# Patient Record
Sex: Female | Born: 1940 | Race: White | Hispanic: No | Smoking: Former smoker
Health system: Southern US, Community
[De-identification: ages and names within clinical notes are randomized; demographics above are authoritative.]

## PROBLEM LIST (undated history)

## (undated) DIAGNOSIS — I10 Essential (primary) hypertension: Secondary | ICD-10-CM

---

## 2011-06-12 DIAGNOSIS — Z79899 Other long term (current) drug therapy: Secondary | ICD-10-CM | POA: Diagnosis not present

## 2011-06-12 DIAGNOSIS — E7800 Pure hypercholesterolemia, unspecified: Secondary | ICD-10-CM | POA: Diagnosis not present

## 2011-06-12 DIAGNOSIS — J209 Acute bronchitis, unspecified: Secondary | ICD-10-CM | POA: Diagnosis not present

## 2011-06-12 DIAGNOSIS — I10 Essential (primary) hypertension: Secondary | ICD-10-CM | POA: Diagnosis not present

## 2011-07-01 DIAGNOSIS — H04129 Dry eye syndrome of unspecified lacrimal gland: Secondary | ICD-10-CM | POA: Diagnosis not present

## 2011-07-01 DIAGNOSIS — H43399 Other vitreous opacities, unspecified eye: Secondary | ICD-10-CM | POA: Diagnosis not present

## 2011-07-01 DIAGNOSIS — IMO0002 Reserved for concepts with insufficient information to code with codable children: Secondary | ICD-10-CM | POA: Diagnosis not present

## 2012-01-02 DIAGNOSIS — Z23 Encounter for immunization: Secondary | ICD-10-CM | POA: Diagnosis not present

## 2012-06-24 DIAGNOSIS — M810 Age-related osteoporosis without current pathological fracture: Secondary | ICD-10-CM | POA: Diagnosis not present

## 2012-06-24 DIAGNOSIS — Z79899 Other long term (current) drug therapy: Secondary | ICD-10-CM | POA: Diagnosis not present

## 2012-06-24 DIAGNOSIS — I10 Essential (primary) hypertension: Secondary | ICD-10-CM | POA: Diagnosis not present

## 2012-06-24 DIAGNOSIS — Z0000 Encounter for general adult medical examination without abnormal findings: Secondary | ICD-10-CM | POA: Diagnosis not present

## 2012-06-24 DIAGNOSIS — E782 Mixed hyperlipidemia: Secondary | ICD-10-CM | POA: Diagnosis not present

## 2012-07-08 DIAGNOSIS — L723 Sebaceous cyst: Secondary | ICD-10-CM | POA: Diagnosis not present

## 2012-09-19 DIAGNOSIS — H10019 Acute follicular conjunctivitis, unspecified eye: Secondary | ICD-10-CM | POA: Diagnosis not present

## 2012-11-22 DIAGNOSIS — H16109 Unspecified superficial keratitis, unspecified eye: Secondary | ICD-10-CM | POA: Diagnosis not present

## 2012-12-22 DIAGNOSIS — H524 Presbyopia: Secondary | ICD-10-CM | POA: Diagnosis not present

## 2012-12-22 DIAGNOSIS — H5200 Hypermetropia, unspecified eye: Secondary | ICD-10-CM | POA: Diagnosis not present

## 2012-12-22 DIAGNOSIS — H52229 Regular astigmatism, unspecified eye: Secondary | ICD-10-CM | POA: Diagnosis not present

## 2012-12-22 DIAGNOSIS — H2589 Other age-related cataract: Secondary | ICD-10-CM | POA: Diagnosis not present

## 2013-02-23 DIAGNOSIS — H524 Presbyopia: Secondary | ICD-10-CM | POA: Diagnosis not present

## 2013-02-23 DIAGNOSIS — H2589 Other age-related cataract: Secondary | ICD-10-CM | POA: Diagnosis not present

## 2013-02-23 DIAGNOSIS — H5200 Hypermetropia, unspecified eye: Secondary | ICD-10-CM | POA: Diagnosis not present

## 2013-02-23 DIAGNOSIS — H52229 Regular astigmatism, unspecified eye: Secondary | ICD-10-CM | POA: Diagnosis not present

## 2013-05-24 DIAGNOSIS — H2589 Other age-related cataract: Secondary | ICD-10-CM | POA: Diagnosis not present

## 2013-05-24 DIAGNOSIS — H52229 Regular astigmatism, unspecified eye: Secondary | ICD-10-CM | POA: Diagnosis not present

## 2013-05-24 DIAGNOSIS — H5200 Hypermetropia, unspecified eye: Secondary | ICD-10-CM | POA: Diagnosis not present

## 2013-05-24 DIAGNOSIS — H524 Presbyopia: Secondary | ICD-10-CM | POA: Diagnosis not present

## 2013-09-02 DIAGNOSIS — H04129 Dry eye syndrome of unspecified lacrimal gland: Secondary | ICD-10-CM | POA: Diagnosis not present

## 2013-09-02 DIAGNOSIS — H2510 Age-related nuclear cataract, unspecified eye: Secondary | ICD-10-CM | POA: Diagnosis not present

## 2013-09-28 DIAGNOSIS — Z780 Asymptomatic menopausal state: Secondary | ICD-10-CM | POA: Diagnosis not present

## 2013-09-28 DIAGNOSIS — R7402 Elevation of levels of lactic acid dehydrogenase (LDH): Secondary | ICD-10-CM | POA: Diagnosis not present

## 2013-09-28 DIAGNOSIS — M810 Age-related osteoporosis without current pathological fracture: Secondary | ICD-10-CM | POA: Diagnosis not present

## 2013-09-28 DIAGNOSIS — R7401 Elevation of levels of liver transaminase levels: Secondary | ICD-10-CM | POA: Diagnosis not present

## 2013-09-28 DIAGNOSIS — R700 Elevated erythrocyte sedimentation rate: Secondary | ICD-10-CM | POA: Diagnosis not present

## 2013-09-28 DIAGNOSIS — Z23 Encounter for immunization: Secondary | ICD-10-CM | POA: Diagnosis not present

## 2013-09-28 DIAGNOSIS — I10 Essential (primary) hypertension: Secondary | ICD-10-CM | POA: Diagnosis not present

## 2013-09-28 DIAGNOSIS — E782 Mixed hyperlipidemia: Secondary | ICD-10-CM | POA: Diagnosis not present

## 2013-10-04 DIAGNOSIS — H268 Other specified cataract: Secondary | ICD-10-CM | POA: Diagnosis not present

## 2013-10-04 DIAGNOSIS — Z961 Presence of intraocular lens: Secondary | ICD-10-CM | POA: Diagnosis not present

## 2013-10-04 DIAGNOSIS — E785 Hyperlipidemia, unspecified: Secondary | ICD-10-CM | POA: Diagnosis not present

## 2013-10-04 DIAGNOSIS — Z9849 Cataract extraction status, unspecified eye: Secondary | ICD-10-CM | POA: Diagnosis not present

## 2013-10-04 DIAGNOSIS — H269 Unspecified cataract: Secondary | ICD-10-CM | POA: Diagnosis not present

## 2013-10-04 DIAGNOSIS — Z79899 Other long term (current) drug therapy: Secondary | ICD-10-CM | POA: Diagnosis not present

## 2013-10-04 DIAGNOSIS — H2510 Age-related nuclear cataract, unspecified eye: Secondary | ICD-10-CM | POA: Diagnosis not present

## 2013-10-04 DIAGNOSIS — Z7982 Long term (current) use of aspirin: Secondary | ICD-10-CM | POA: Diagnosis not present

## 2013-10-04 DIAGNOSIS — H2589 Other age-related cataract: Secondary | ICD-10-CM | POA: Diagnosis not present

## 2013-10-04 DIAGNOSIS — I10 Essential (primary) hypertension: Secondary | ICD-10-CM | POA: Diagnosis not present

## 2013-10-04 DIAGNOSIS — Z87891 Personal history of nicotine dependence: Secondary | ICD-10-CM | POA: Diagnosis not present

## 2013-10-21 DIAGNOSIS — H04123 Dry eye syndrome of bilateral lacrimal glands: Secondary | ICD-10-CM | POA: Diagnosis not present

## 2013-11-29 DIAGNOSIS — Z9841 Cataract extraction status, right eye: Secondary | ICD-10-CM | POA: Diagnosis not present

## 2013-11-29 DIAGNOSIS — H2511 Age-related nuclear cataract, right eye: Secondary | ICD-10-CM | POA: Diagnosis not present

## 2013-11-29 DIAGNOSIS — I10 Essential (primary) hypertension: Secondary | ICD-10-CM | POA: Diagnosis not present

## 2013-11-29 DIAGNOSIS — E785 Hyperlipidemia, unspecified: Secondary | ICD-10-CM | POA: Diagnosis not present

## 2013-11-29 DIAGNOSIS — H269 Unspecified cataract: Secondary | ICD-10-CM | POA: Diagnosis not present

## 2013-11-29 DIAGNOSIS — H25811 Combined forms of age-related cataract, right eye: Secondary | ICD-10-CM | POA: Diagnosis not present

## 2013-11-29 DIAGNOSIS — Z87891 Personal history of nicotine dependence: Secondary | ICD-10-CM | POA: Diagnosis not present

## 2013-11-29 DIAGNOSIS — Z961 Presence of intraocular lens: Secondary | ICD-10-CM | POA: Diagnosis not present

## 2013-11-29 DIAGNOSIS — Z79899 Other long term (current) drug therapy: Secondary | ICD-10-CM | POA: Diagnosis not present

## 2014-03-10 DIAGNOSIS — I10 Essential (primary) hypertension: Secondary | ICD-10-CM | POA: Diagnosis not present

## 2014-03-10 DIAGNOSIS — H5202 Hypermetropia, left eye: Secondary | ICD-10-CM | POA: Diagnosis not present

## 2014-03-10 DIAGNOSIS — H52222 Regular astigmatism, left eye: Secondary | ICD-10-CM | POA: Diagnosis not present

## 2014-03-10 DIAGNOSIS — H524 Presbyopia: Secondary | ICD-10-CM | POA: Diagnosis not present

## 2014-03-10 DIAGNOSIS — H16222 Keratoconjunctivitis sicca, not specified as Sjogren's, left eye: Secondary | ICD-10-CM | POA: Diagnosis not present

## 2014-11-14 DIAGNOSIS — Z23 Encounter for immunization: Secondary | ICD-10-CM | POA: Diagnosis not present

## 2014-11-14 DIAGNOSIS — Z79899 Other long term (current) drug therapy: Secondary | ICD-10-CM | POA: Diagnosis not present

## 2014-11-14 DIAGNOSIS — E7800 Pure hypercholesterolemia, unspecified: Secondary | ICD-10-CM | POA: Diagnosis not present

## 2014-11-14 DIAGNOSIS — I10 Essential (primary) hypertension: Secondary | ICD-10-CM | POA: Diagnosis not present

## 2014-11-14 DIAGNOSIS — Z1389 Encounter for screening for other disorder: Secondary | ICD-10-CM | POA: Diagnosis not present

## 2014-11-14 DIAGNOSIS — R740 Nonspecific elevation of levels of transaminase and lactic acid dehydrogenase [LDH]: Secondary | ICD-10-CM | POA: Diagnosis not present

## 2014-11-14 DIAGNOSIS — Z0000 Encounter for general adult medical examination without abnormal findings: Secondary | ICD-10-CM | POA: Diagnosis not present

## 2014-11-17 DIAGNOSIS — Z23 Encounter for immunization: Secondary | ICD-10-CM | POA: Diagnosis not present

## 2014-11-23 DIAGNOSIS — D539 Nutritional anemia, unspecified: Secondary | ICD-10-CM | POA: Diagnosis not present

## 2014-12-01 DIAGNOSIS — Z1211 Encounter for screening for malignant neoplasm of colon: Secondary | ICD-10-CM | POA: Diagnosis not present

## 2015-01-16 DIAGNOSIS — M4850XA Collapsed vertebra, not elsewhere classified, site unspecified, initial encounter for fracture: Secondary | ICD-10-CM | POA: Diagnosis not present

## 2015-01-16 DIAGNOSIS — D539 Nutritional anemia, unspecified: Secondary | ICD-10-CM | POA: Diagnosis not present

## 2015-01-16 DIAGNOSIS — M545 Low back pain: Secondary | ICD-10-CM | POA: Diagnosis not present

## 2015-02-02 DIAGNOSIS — M545 Low back pain: Secondary | ICD-10-CM | POA: Diagnosis not present

## 2015-04-13 DIAGNOSIS — I10 Essential (primary) hypertension: Secondary | ICD-10-CM | POA: Diagnosis not present

## 2015-04-13 DIAGNOSIS — H43813 Vitreous degeneration, bilateral: Secondary | ICD-10-CM | POA: Diagnosis not present

## 2015-04-13 DIAGNOSIS — Z961 Presence of intraocular lens: Secondary | ICD-10-CM | POA: Diagnosis not present

## 2015-04-13 DIAGNOSIS — H5203 Hypermetropia, bilateral: Secondary | ICD-10-CM | POA: Diagnosis not present

## 2015-04-13 DIAGNOSIS — H353131 Nonexudative age-related macular degeneration, bilateral, early dry stage: Secondary | ICD-10-CM | POA: Diagnosis not present

## 2015-04-13 DIAGNOSIS — H43393 Other vitreous opacities, bilateral: Secondary | ICD-10-CM | POA: Diagnosis not present

## 2015-04-13 DIAGNOSIS — H35373 Puckering of macula, bilateral: Secondary | ICD-10-CM | POA: Diagnosis not present

## 2015-04-13 DIAGNOSIS — Z9841 Cataract extraction status, right eye: Secondary | ICD-10-CM | POA: Diagnosis not present

## 2015-04-13 DIAGNOSIS — Z9842 Cataract extraction status, left eye: Secondary | ICD-10-CM | POA: Diagnosis not present

## 2015-04-13 DIAGNOSIS — H52223 Regular astigmatism, bilateral: Secondary | ICD-10-CM | POA: Diagnosis not present

## 2015-04-13 DIAGNOSIS — H524 Presbyopia: Secondary | ICD-10-CM | POA: Diagnosis not present

## 2015-04-26 DIAGNOSIS — H353132 Nonexudative age-related macular degeneration, bilateral, intermediate dry stage: Secondary | ICD-10-CM | POA: Diagnosis not present

## 2015-05-31 DIAGNOSIS — H353132 Nonexudative age-related macular degeneration, bilateral, intermediate dry stage: Secondary | ICD-10-CM | POA: Diagnosis not present

## 2015-08-21 DIAGNOSIS — M546 Pain in thoracic spine: Secondary | ICD-10-CM | POA: Diagnosis not present

## 2015-08-27 DIAGNOSIS — M5134 Other intervertebral disc degeneration, thoracic region: Secondary | ICD-10-CM | POA: Diagnosis not present

## 2015-08-27 DIAGNOSIS — M4854XA Collapsed vertebra, not elsewhere classified, thoracic region, initial encounter for fracture: Secondary | ICD-10-CM | POA: Diagnosis not present

## 2015-08-27 DIAGNOSIS — M546 Pain in thoracic spine: Secondary | ICD-10-CM | POA: Diagnosis not present

## 2015-08-28 DIAGNOSIS — M546 Pain in thoracic spine: Secondary | ICD-10-CM | POA: Diagnosis not present

## 2015-09-03 DIAGNOSIS — H353131 Nonexudative age-related macular degeneration, bilateral, early dry stage: Secondary | ICD-10-CM | POA: Diagnosis not present

## 2015-09-03 DIAGNOSIS — I10 Essential (primary) hypertension: Secondary | ICD-10-CM | POA: Diagnosis not present

## 2015-09-18 DIAGNOSIS — M546 Pain in thoracic spine: Secondary | ICD-10-CM | POA: Diagnosis not present

## 2015-11-27 DIAGNOSIS — M810 Age-related osteoporosis without current pathological fracture: Secondary | ICD-10-CM | POA: Diagnosis not present

## 2015-11-27 DIAGNOSIS — R700 Elevated erythrocyte sedimentation rate: Secondary | ICD-10-CM | POA: Diagnosis not present

## 2015-11-27 DIAGNOSIS — Z79899 Other long term (current) drug therapy: Secondary | ICD-10-CM | POA: Diagnosis not present

## 2015-11-27 DIAGNOSIS — M4850XA Collapsed vertebra, not elsewhere classified, site unspecified, initial encounter for fracture: Secondary | ICD-10-CM | POA: Diagnosis not present

## 2015-11-27 DIAGNOSIS — Z23 Encounter for immunization: Secondary | ICD-10-CM | POA: Diagnosis not present

## 2015-11-27 DIAGNOSIS — I10 Essential (primary) hypertension: Secondary | ICD-10-CM | POA: Diagnosis not present

## 2015-11-27 DIAGNOSIS — E782 Mixed hyperlipidemia: Secondary | ICD-10-CM | POA: Diagnosis not present

## 2015-11-27 DIAGNOSIS — Z0000 Encounter for general adult medical examination without abnormal findings: Secondary | ICD-10-CM | POA: Diagnosis not present

## 2015-11-27 DIAGNOSIS — D539 Nutritional anemia, unspecified: Secondary | ICD-10-CM | POA: Diagnosis not present

## 2015-12-07 DIAGNOSIS — M810 Age-related osteoporosis without current pathological fracture: Secondary | ICD-10-CM | POA: Diagnosis not present

## 2015-12-20 DIAGNOSIS — K219 Gastro-esophageal reflux disease without esophagitis: Secondary | ICD-10-CM | POA: Diagnosis not present

## 2015-12-20 DIAGNOSIS — D510 Vitamin B12 deficiency anemia due to intrinsic factor deficiency: Secondary | ICD-10-CM | POA: Diagnosis not present

## 2015-12-20 DIAGNOSIS — D649 Anemia, unspecified: Secondary | ICD-10-CM | POA: Diagnosis not present

## 2016-01-01 DIAGNOSIS — D649 Anemia, unspecified: Secondary | ICD-10-CM | POA: Diagnosis not present

## 2016-01-08 DIAGNOSIS — D649 Anemia, unspecified: Secondary | ICD-10-CM | POA: Diagnosis not present

## 2016-01-11 DIAGNOSIS — D124 Benign neoplasm of descending colon: Secondary | ICD-10-CM | POA: Diagnosis not present

## 2016-01-11 DIAGNOSIS — K2970 Gastritis, unspecified, without bleeding: Secondary | ICD-10-CM | POA: Diagnosis not present

## 2016-01-11 DIAGNOSIS — K2950 Unspecified chronic gastritis without bleeding: Secondary | ICD-10-CM | POA: Diagnosis not present

## 2016-01-11 DIAGNOSIS — K5730 Diverticulosis of large intestine without perforation or abscess without bleeding: Secondary | ICD-10-CM | POA: Diagnosis not present

## 2016-01-11 DIAGNOSIS — D126 Benign neoplasm of colon, unspecified: Secondary | ICD-10-CM | POA: Diagnosis not present

## 2016-01-11 DIAGNOSIS — D509 Iron deficiency anemia, unspecified: Secondary | ICD-10-CM | POA: Diagnosis not present

## 2016-01-11 DIAGNOSIS — Q2733 Arteriovenous malformation of digestive system vessel: Secondary | ICD-10-CM | POA: Diagnosis not present

## 2016-01-11 DIAGNOSIS — K644 Residual hemorrhoidal skin tags: Secondary | ICD-10-CM | POA: Diagnosis not present

## 2016-01-11 DIAGNOSIS — K635 Polyp of colon: Secondary | ICD-10-CM | POA: Diagnosis not present

## 2016-01-15 DIAGNOSIS — D649 Anemia, unspecified: Secondary | ICD-10-CM | POA: Diagnosis not present

## 2016-02-14 DIAGNOSIS — D649 Anemia, unspecified: Secondary | ICD-10-CM | POA: Diagnosis not present

## 2016-02-18 DIAGNOSIS — D126 Benign neoplasm of colon, unspecified: Secondary | ICD-10-CM | POA: Diagnosis not present

## 2016-02-18 DIAGNOSIS — K644 Residual hemorrhoidal skin tags: Secondary | ICD-10-CM | POA: Diagnosis not present

## 2016-02-18 DIAGNOSIS — K5730 Diverticulosis of large intestine without perforation or abscess without bleeding: Secondary | ICD-10-CM | POA: Diagnosis not present

## 2016-02-18 DIAGNOSIS — D509 Iron deficiency anemia, unspecified: Secondary | ICD-10-CM | POA: Diagnosis not present

## 2016-03-20 DIAGNOSIS — D649 Anemia, unspecified: Secondary | ICD-10-CM | POA: Diagnosis not present

## 2016-05-05 DIAGNOSIS — D649 Anemia, unspecified: Secondary | ICD-10-CM | POA: Diagnosis not present

## 2016-06-10 DIAGNOSIS — Z1389 Encounter for screening for other disorder: Secondary | ICD-10-CM | POA: Diagnosis not present

## 2016-06-10 DIAGNOSIS — M545 Low back pain: Secondary | ICD-10-CM | POA: Diagnosis not present

## 2016-06-10 DIAGNOSIS — Z6821 Body mass index (BMI) 21.0-21.9, adult: Secondary | ICD-10-CM | POA: Diagnosis not present

## 2016-06-10 DIAGNOSIS — R609 Edema, unspecified: Secondary | ICD-10-CM | POA: Diagnosis not present

## 2016-06-12 DIAGNOSIS — Z79899 Other long term (current) drug therapy: Secondary | ICD-10-CM | POA: Diagnosis not present

## 2016-06-12 DIAGNOSIS — R262 Difficulty in walking, not elsewhere classified: Secondary | ICD-10-CM | POA: Diagnosis not present

## 2016-06-12 DIAGNOSIS — M8448XA Pathological fracture, other site, initial encounter for fracture: Secondary | ICD-10-CM | POA: Diagnosis not present

## 2016-06-12 DIAGNOSIS — M549 Dorsalgia, unspecified: Secondary | ICD-10-CM | POA: Diagnosis not present

## 2016-06-12 DIAGNOSIS — R279 Unspecified lack of coordination: Secondary | ICD-10-CM | POA: Diagnosis not present

## 2016-06-12 DIAGNOSIS — K219 Gastro-esophageal reflux disease without esophagitis: Secondary | ICD-10-CM | POA: Diagnosis present

## 2016-06-12 DIAGNOSIS — Z7401 Bed confinement status: Secondary | ICD-10-CM | POA: Diagnosis not present

## 2016-06-12 DIAGNOSIS — B9622 Other specified Shiga toxin-producing Escherichia coli [E. coli] (STEC) as the cause of diseases classified elsewhere: Secondary | ICD-10-CM | POA: Diagnosis present

## 2016-06-12 DIAGNOSIS — I10 Essential (primary) hypertension: Secondary | ICD-10-CM | POA: Diagnosis present

## 2016-06-12 DIAGNOSIS — N390 Urinary tract infection, site not specified: Secondary | ICD-10-CM | POA: Diagnosis not present

## 2016-06-12 DIAGNOSIS — M545 Low back pain: Secondary | ICD-10-CM | POA: Diagnosis not present

## 2016-06-12 DIAGNOSIS — S3210XD Unspecified fracture of sacrum, subsequent encounter for fracture with routine healing: Secondary | ICD-10-CM | POA: Diagnosis not present

## 2016-06-12 DIAGNOSIS — S3992XA Unspecified injury of lower back, initial encounter: Secondary | ICD-10-CM | POA: Diagnosis not present

## 2016-06-12 DIAGNOSIS — Z87891 Personal history of nicotine dependence: Secondary | ICD-10-CM | POA: Diagnosis not present

## 2016-06-12 DIAGNOSIS — D649 Anemia, unspecified: Secondary | ICD-10-CM | POA: Diagnosis present

## 2016-06-12 DIAGNOSIS — S299XXA Unspecified injury of thorax, initial encounter: Secondary | ICD-10-CM | POA: Diagnosis not present

## 2016-06-12 DIAGNOSIS — D696 Thrombocytopenia, unspecified: Secondary | ICD-10-CM | POA: Diagnosis present

## 2016-06-12 DIAGNOSIS — R531 Weakness: Secondary | ICD-10-CM | POA: Diagnosis not present

## 2016-06-12 DIAGNOSIS — M546 Pain in thoracic spine: Secondary | ICD-10-CM | POA: Diagnosis not present

## 2016-06-12 DIAGNOSIS — J90 Pleural effusion, not elsewhere classified: Secondary | ICD-10-CM | POA: Diagnosis not present

## 2016-06-12 DIAGNOSIS — E860 Dehydration: Secondary | ICD-10-CM | POA: Diagnosis not present

## 2016-06-12 DIAGNOSIS — N3001 Acute cystitis with hematuria: Secondary | ICD-10-CM | POA: Diagnosis not present

## 2016-06-17 DIAGNOSIS — M549 Dorsalgia, unspecified: Secondary | ICD-10-CM | POA: Diagnosis not present

## 2016-06-17 DIAGNOSIS — R279 Unspecified lack of coordination: Secondary | ICD-10-CM | POA: Diagnosis not present

## 2016-06-17 DIAGNOSIS — M8448XA Pathological fracture, other site, initial encounter for fracture: Secondary | ICD-10-CM | POA: Diagnosis not present

## 2016-06-17 DIAGNOSIS — I119 Hypertensive heart disease without heart failure: Secondary | ICD-10-CM | POA: Diagnosis not present

## 2016-06-17 DIAGNOSIS — M545 Low back pain: Secondary | ICD-10-CM | POA: Diagnosis not present

## 2016-06-17 DIAGNOSIS — M439 Deforming dorsopathy, unspecified: Secondary | ICD-10-CM | POA: Diagnosis not present

## 2016-06-17 DIAGNOSIS — Z7401 Bed confinement status: Secondary | ICD-10-CM | POA: Diagnosis not present

## 2016-06-17 DIAGNOSIS — S3210XD Unspecified fracture of sacrum, subsequent encounter for fracture with routine healing: Secondary | ICD-10-CM | POA: Diagnosis not present

## 2016-06-17 DIAGNOSIS — Z538 Procedure and treatment not carried out for other reasons: Secondary | ICD-10-CM | POA: Diagnosis not present

## 2016-06-17 DIAGNOSIS — I10 Essential (primary) hypertension: Secondary | ICD-10-CM | POA: Diagnosis not present

## 2016-06-17 DIAGNOSIS — S3210XA Unspecified fracture of sacrum, initial encounter for closed fracture: Secondary | ICD-10-CM | POA: Diagnosis not present

## 2016-06-17 DIAGNOSIS — R262 Difficulty in walking, not elsewhere classified: Secondary | ICD-10-CM | POA: Diagnosis not present

## 2016-06-17 DIAGNOSIS — N390 Urinary tract infection, site not specified: Secondary | ICD-10-CM | POA: Diagnosis not present

## 2016-06-17 DIAGNOSIS — N3001 Acute cystitis with hematuria: Secondary | ICD-10-CM | POA: Diagnosis not present

## 2016-06-17 DIAGNOSIS — Z87891 Personal history of nicotine dependence: Secondary | ICD-10-CM | POA: Diagnosis not present

## 2016-06-17 DIAGNOSIS — Z880 Allergy status to penicillin: Secondary | ICD-10-CM | POA: Diagnosis not present

## 2016-06-18 ENCOUNTER — Other Ambulatory Visit (HOSPITAL_COMMUNITY): Payer: Self-pay | Admitting: Interventional Radiology

## 2016-06-18 DIAGNOSIS — M549 Dorsalgia, unspecified: Secondary | ICD-10-CM | POA: Diagnosis not present

## 2016-06-18 DIAGNOSIS — I119 Hypertensive heart disease without heart failure: Secondary | ICD-10-CM | POA: Diagnosis not present

## 2016-06-18 DIAGNOSIS — M439 Deforming dorsopathy, unspecified: Secondary | ICD-10-CM | POA: Diagnosis not present

## 2016-06-18 DIAGNOSIS — S3210XA Unspecified fracture of sacrum, initial encounter for closed fracture: Secondary | ICD-10-CM

## 2016-06-18 DIAGNOSIS — N390 Urinary tract infection, site not specified: Secondary | ICD-10-CM | POA: Diagnosis not present

## 2016-06-26 ENCOUNTER — Ambulatory Visit (HOSPITAL_COMMUNITY)
Admission: RE | Admit: 2016-06-26 | Discharge: 2016-06-26 | Disposition: A | Discharge status: Home / Self Care | Payer: Medicare Other | Source: Ambulatory Visit | Attending: Interventional Radiology | Admitting: Interventional Radiology

## 2016-06-26 DIAGNOSIS — S3210XA Unspecified fracture of sacrum, initial encounter for closed fracture: Secondary | ICD-10-CM

## 2016-06-26 DIAGNOSIS — M545 Low back pain: Secondary | ICD-10-CM | POA: Diagnosis not present

## 2016-06-26 HISTORY — PX: IR RADIOLOGIST EVAL & MGMT: IMG5224

## 2016-07-01 ENCOUNTER — Encounter (HOSPITAL_COMMUNITY): Payer: Self-pay | Admitting: Interventional Radiology

## 2016-07-01 ENCOUNTER — Other Ambulatory Visit (HOSPITAL_COMMUNITY): Payer: Self-pay | Admitting: Interventional Radiology

## 2016-07-01 DIAGNOSIS — S3210XA Unspecified fracture of sacrum, initial encounter for closed fracture: Secondary | ICD-10-CM

## 2016-07-02 ENCOUNTER — Telehealth (HOSPITAL_COMMUNITY): Payer: Self-pay | Admitting: Radiology

## 2016-07-02 NOTE — Telephone Encounter (Signed)
Dr. Estanislado Pandy returned a call to Dr. Bertram Millard, MD at [phone]. He was told that Dr. Humphrey Rolls was not in. He left a message with the staff that he was returning his call. JM

## 2016-07-04 ENCOUNTER — Telehealth (HOSPITAL_COMMUNITY): Payer: Self-pay

## 2016-07-07 ENCOUNTER — Other Ambulatory Visit: Payer: Self-pay | Admitting: Radiology

## 2016-07-08 ENCOUNTER — Encounter (HOSPITAL_COMMUNITY): Payer: Self-pay

## 2016-07-08 ENCOUNTER — Ambulatory Visit (HOSPITAL_COMMUNITY)
Admission: RE | Admit: 2016-07-08 | Discharge: 2016-07-08 | Disposition: A | Discharge status: Home / Self Care | Payer: No Typology Code available for payment source | Source: Ambulatory Visit | Attending: Interventional Radiology | Admitting: Interventional Radiology

## 2016-07-08 ENCOUNTER — Other Ambulatory Visit: Payer: Self-pay | Admitting: Radiology

## 2016-07-08 DIAGNOSIS — Z87891 Personal history of nicotine dependence: Secondary | ICD-10-CM | POA: Diagnosis not present

## 2016-07-08 DIAGNOSIS — I10 Essential (primary) hypertension: Secondary | ICD-10-CM | POA: Insufficient documentation

## 2016-07-08 DIAGNOSIS — M8448XA Pathological fracture, other site, initial encounter for fracture: Secondary | ICD-10-CM | POA: Insufficient documentation

## 2016-07-08 DIAGNOSIS — Z538 Procedure and treatment not carried out for other reasons: Secondary | ICD-10-CM | POA: Insufficient documentation

## 2016-07-08 DIAGNOSIS — M545 Low back pain: Secondary | ICD-10-CM | POA: Diagnosis not present

## 2016-07-08 DIAGNOSIS — Z880 Allergy status to penicillin: Secondary | ICD-10-CM | POA: Diagnosis not present

## 2016-07-08 DIAGNOSIS — S3210XA Unspecified fracture of sacrum, initial encounter for closed fracture: Secondary | ICD-10-CM

## 2016-07-08 HISTORY — DX: Essential (primary) hypertension: I10

## 2016-07-08 LAB — CBC
HCT: 35.8 % — ABNORMAL LOW (ref 36.0–46.0)
HEMOGLOBIN: 11.8 g/dL — AB (ref 12.0–15.0)
MCH: 29.1 pg (ref 26.0–34.0)
MCHC: 33 g/dL (ref 30.0–36.0)
MCV: 88.4 fL (ref 78.0–100.0)
PLATELETS: 144 10*3/uL — AB (ref 150–400)
RBC: 4.05 MIL/uL (ref 3.87–5.11)
RDW: 16 % — AB (ref 11.5–15.5)
WBC: 3.8 10*3/uL — ABNORMAL LOW (ref 4.0–10.5)

## 2016-07-08 LAB — BASIC METABOLIC PANEL
Anion gap: 9 (ref 5–15)
BUN: 19 mg/dL (ref 6–20)
CALCIUM: 9.4 mg/dL (ref 8.9–10.3)
CO2: 25 mmol/L (ref 22–32)
CREATININE: 0.9 mg/dL (ref 0.44–1.00)
Chloride: 100 mmol/L — ABNORMAL LOW (ref 101–111)
GFR calc Af Amer: >60 mL/min (ref >60)
GLUCOSE: 118 mg/dL — AB (ref 65–99)
Potassium: 3.5 mmol/L (ref 3.5–5.1)
Sodium: 134 mmol/L — ABNORMAL LOW (ref 135–145)

## 2016-07-08 LAB — PROTIME-INR
INR: 1.15
PROTHROMBIN TIME: 14.8 s (ref 11.4–15.2)

## 2016-07-08 LAB — APTT: aPTT: 38 seconds — ABNORMAL HIGH (ref 24–36)

## 2016-07-08 MED ORDER — SODIUM CHLORIDE 0.9 % IV SOLN: INTRAVENOUS | Status: DC

## 2016-07-08 MED ORDER — VANCOMYCIN HCL IN DEXTROSE 1-5 GM/200ML-% IV SOLN: 1000.0000 mg | INTRAVENOUS | Status: DC

## 2016-07-08 MED ORDER — CEFAZOLIN SODIUM-DEXTROSE 2-4 GM/100ML-% IV SOLN: 2.0000 g | INTRAVENOUS | Status: DC

## 2016-07-08 NOTE — H&P (Signed)
Chief Complaint: Ross was seen in consultation today for Bilateral sacroplasty at Darlene request of Dr Sarina Ser  Referring Physician(s): Dr Sarina Ser  Supervising Physician: Luanne Bras  Ross Status: [facility] - Out-pt  History of Present Illness: Darlene Ross is a 76 y.o. female   Painful back and buttock area for weeks Denies injury Dr Estanislado Pandy note: 06/26/16: Darlene Ross's recent MRI scan of Darlene lumbosacral spine was reviewed with her and her family. 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  Scheduled now for Sacroplasty Last lovenox dose 07/04/16 (prophylactic dose in Rehab facility for back pain)  Past Medical History:  Diagnosis Date  . Hypertension     Past Surgical History:  Procedure Laterality Date  . IR RADIOLOGIST EVAL & MGMT  06/26/2016    Allergies: Penicillins  Medications: Prior to Admission medications   Medication Sig Start Date End Date Taking? Authorizing Provider  acetaminophen (TYLENOL) 325 MG tablet Take 650 mg by mouth 4 (four) times daily.   Yes [provider]  alum & mag hydroxide-simeth (GERI-LANTA) 200-200-20 MG/5ML suspension Take 30 mLs by mouth every 3 (three) hours as needed for indigestion or heartburn.   Yes [provider]  calcium-vitamin D (OSCAL WITH D) 500-200 MG-UNIT tablet Take 1 tablet by mouth daily.   Yes [provider]  Cholecalciferol (VITAMIN D3) 1000 units CAPS Take 2,000 Units by mouth daily.   Yes [provider]  enoxaparin (LOVENOX) 40 MG/0.4ML injection Inject 40 mg into Darlene skin daily. 06/17/16  Yes [provider]  Liniments (SALONPAS EX) Apply 1 patch topically daily. Remove in Darlene evening.   Yes [provider]  magnesium hydroxide (MILK OF MAGNESIA) 400 MG/5ML suspension Take 30 mLs by mouth daily as needed for mild constipation.   Yes [provider]  Multiple Vitamins-Minerals  (PRESERVISION AREDS PO) Take 1 capsule by mouth daily.   Yes [provider]  nabumetone (RELAFEN) 750 MG tablet Take 750 mg by mouth 2 (two) times daily.   Yes [provider]  omeprazole (PRILOSEC) 20 MG capsule Take 20 mg by mouth daily.   Yes [provider]  oxyCODONE (OXY IR/ROXICODONE) 5 MG immediate release tablet Take 7.5 mg by mouth See admin instructions. Take 7.5 mg by mouth 30 minutes prior to therapy, scheduled time is at 0715. Take 7.5 mg by mouth every 3 hours as needed for pain. Pain scale; 0= no pain, 1-3= mild pain, 4-6= moderate pain, 7-9= severe pain, 10= unbearable pain   Yes [provider]  polyethylene glycol (MIRALAX / GLYCOLAX) packet Take 17 g by mouth daily as needed for moderate constipation.   Yes [provider]  senna-docusate (SENOKOT S) 8.6-50 MG tablet Take 2 tablets by mouth 2 (two) times daily as needed for mild constipation.   Yes [provider]  valsartan-hydrochlorothiazide (DIOVAN-HCT) 160-12.5 MG tablet Take 1 tablet by mouth daily.   Yes [provider]     History reviewed. No pertinent family history.  Social History   Social History  . Marital status: Widowed    Spouse name: N/A  . Number of children: N/A  . Years of education: N/A   Social History Main Topics  . Smoking status: Former Smoker    Quit date: 07/08/1988  . Smokeless tobacco: None  . Alcohol use None  . Drug use: Unknown  . Sexual activity: Not Asked   Other Topics Concern  .  None   Social History Narrative  . None   Review of Systems: A 12 point ROS discussed and pertinent positives are indicated in Darlene HPI above.  All other systems are negative.  Review of Systems  Constitutional: Positive for activity change and fatigue. Negative for appetite change and fever.  Respiratory: Negative for cough and shortness of breath.   Cardiovascular: Negative for chest pain.  Gastrointestinal: Negative for abdominal  pain.  Musculoskeletal: Positive for back pain.       Buttock pain  Neurological: Negative for weakness.  Psychiatric/Behavioral: Negative for behavioral problems and confusion.    Vital Signs: BP (!) 166/70 (BP Location: Right Arm)   Pulse 90   Temp 97.9 F (36.6 C) (Oral)   Ht 5\' 2"  (1.575 m)   Wt 125 lb (56.7 kg)   SpO2 97%   BMI 22.86 kg/m   Physical Exam  Constitutional: She is oriented to person, place, and time.  Cardiovascular: Normal rate, regular rhythm and normal heart sounds.   Pulmonary/Chest: Effort normal and breath sounds normal.  Abdominal: Soft. Bowel sounds are normal.  Musculoskeletal: Normal range of motion.  Back pain  Neurological: She is alert and oriented to person, place, and time.  Skin: Skin is warm and dry.  Psychiatric: She has a normal mood and affect. Her behavior is normal. Judgment and thought content normal.  Nursing note and vitals reviewed.   Mallampati Score:  MD Evaluation Airway: WNL Heart: WNL Abdomen: WNL Chest/ Lungs: WNL ASA  Classification: 3 Mallampati/Airway Score: One  Imaging: Ir Radiologist Eval & Mgmt  Result Date: 07/01/2016 EXAM: NEW Ross OFFICE VISIT CHIEF COMPLAINT: Severe low back and buttock pain secondary to sacral insufficiency fractures. Current Pain Level: 1-10 HISTORY OF PRESENT ILLNESS: Darlene Ross is a 76 year old lady who has been referred for evaluation and treatment of severely painful sacral insufficiency fractures. Darlene Ross is accompanied by her daughter. Darlene Ross apparently started experiencing severe pain in Darlene buttock region some time on May 23rd without a history of injury, stooping, bending or lifting. Darlene Ross reports gradual worsening Darlene pain to where Darlene Ross has been unable to sit or stand for prolonged periods of time, or to turn in bed. She describes this pain as being a sharp gnawing pain with occasional radiation over Darlene lateral aspect of her left thigh. No associated  paresthesias or weakness in her lower extremities noted. No radicular distribution into Darlene toes noted either. Darlene Ross reports this pain to be a 9 out of 10 especially while sitting or standing for prolonged periods of time. Darlene pain has impacted her ambulation to where she is now ambulating with a walker. Darlene pain is also interfering with her lifestyle in that she is unable to perform her routine daily activities and also driving. Darlene Ross has been participating in gentle physical therapy, walking with a walker and arm strengthening exercises. However, these are limited by Darlene pain. Ross reports mild-to-moderate relief of Darlene pain with Oxy IR which she takes 2 to 3 times a day with partial relief. It enables her to sleep at nighttime. She denies any autonomic dysfunction of her bowel or bladder activities. She denies recent chills, fever or rigors. Her appetite is normal. Weight is steady. She denies any symptoms of dysuria, hematuria or polyuria. She denies recent chest pain, shortness of breath, or palpitations. She denies any coughing or wheezing or coughing up of blood. Denies any constipation or diarrhea or abdominal pain, or bloody stools. Past  Medical History: Vertebral body compression fractures in 2017. Osteoporosis. Elevated sed rate, benign essential hypertension. Elevated transaminase levels. Mixed dyslipidemia. Medications: Celebrex. Omeprazole. Calcium carbonate vitamin-D combination tablets. Valsartan hydrochlorothiazide. PreserVision. Aspirin 81 mg a day. Oxy IR. Tylenol as needed. Allergies: Penicillins cause her to swell up with whelps. Social History: Has 2 children, 1 son and 1 daughter alive and well. She denies drinking alcohol or smoking cigarettes or using illicit chemicals. She is on no special diet. Family History: Mother deceased age 86 Alzheimer's disease, heart problems. Father deceased age 43 of kidney disease. REVIEW OF SYSTEMS: As above. PHYSICAL EXAMINATION: Appears in  mild distress on account of her pain. Affect appropriate to Darlene situation. Neurologically grossly intact. No lateralizing cranial nerve, motor, sensory, coordination difficulties. Station and gait not tested. ASSESSMENT AND PLAN: Darlene Ross's recent MRI scan of Darlene lumbosacral spine was reviewed with her and her family. 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 Given Darlene Ross's clinical history and examination, it was felt Darlene Ross would be appropriate for augmentation of Darlene sacral insufficiency fractures with methylmethacrylate. Darlene procedure, Darlene risks, benefits were all reviewed in detail. Questions were answered to their satisfaction. Darlene Ross and her daughter would like to proceed with Darlene procedure. This will be scheduled as early as possible. In Darlene meantime, she has been asked to continue taking her medications as prescribed, and to refrain from lifting weights more than 10 pounds and avoid stooping or bending or sitting on hard surfaces. There were asked to call should they have any concerns or questions. Darlene Ross any and Darlene daughter leave with good understanding and agreement with Darlene above management plan. Also Ross to stop Lovenox 24 hours prior to Darlene procedure. Electronically Signed   By: Luanne Bras M.D.   On: 06/27/2016 15:53    Labs:  CBC:  Recent Labs  07/08/16 0729  WBC 3.8*  HGB 11.8*  HCT 35.8*  PLT 144*    COAGS:  Recent Labs  07/08/16 0729  INR 1.15  APTT 38*    BMP:  Recent Labs  07/08/16 0729  NA 134*  K 3.5  CL 100*  CO2 25  GLUCOSE 118*  BUN 19  CALCIUM 9.4  CREATININE 0.90  GFRNONAA >60  GFRAA >60    LIVER FUNCTION TESTS: No results for input(s): BILITOT, AST, ALT, ALKPHOS, PROT, ALBUMIN in Darlene last 8760 hours.  TUMOR MARKERS: No results for input(s): AFPTM, CEA, CA199, CHROMGRNA in Darlene last 8760 hours.  Assessment and Plan:  Buttock pain for weeks No  injury Outside MRI does reveal acute B sacral insufficiency fractures Scheduled now for B sacroplasty Risks and Benefits discussed with Darlene Ross including, but not limited to education regarding Darlene natural healing process of compression fractures without intervention, bleeding, infection, cement migration which may cause spinal cord damage, paralysis, pulmonary embolism or even death. All of Darlene Ross's questions were answered, Ross is agreeable to proceed. Consent signed and in chart.  Thank you for this interesting consult.  I greatly enjoyed meeting Deidra Spease and look forward to participating in their care.  A copy of this report was sent to Darlene requesting provider on this date.  Electronically Signed: Lavonia Drafts, PA-C 07/08/2016, 10:24 AM   I spent a total of  30 Minutes   in face to face in clinical consultation, greater than 50% of which was counseling/coordinating care for Sacroplasty

## 2016-07-08 NOTE — Progress Notes (Signed)
Patient ID: Darlene Ross, female   DOB: 09-12-1940, 76 y.o.   MRN: [id number]   Pt was scheduled for sacroplasty today in IR Unfortunately, Dr Estanislado Pandy has had 2 new emergent cases and is unable to perform  This procedure today.  Rescheduled to Fri 6/22 Pt to come to Rad 1030 am will NOT need new labs---only IV  Pt and family aware and agreeable

## 2016-07-09 ENCOUNTER — Other Ambulatory Visit: Payer: Self-pay | Admitting: Radiology

## 2016-07-10 ENCOUNTER — Other Ambulatory Visit: Payer: Self-pay | Admitting: General Surgery

## 2016-07-11 ENCOUNTER — Ambulatory Visit (HOSPITAL_COMMUNITY)
Admission: RE | Admit: 2016-07-11 | Discharge: 2016-07-11 | Disposition: A | Discharge status: Home / Self Care | Payer: No Typology Code available for payment source | Source: Ambulatory Visit | Attending: Interventional Radiology | Admitting: Interventional Radiology

## 2016-07-11 ENCOUNTER — Encounter (HOSPITAL_COMMUNITY): Payer: Self-pay

## 2016-07-11 DIAGNOSIS — M8448XA Pathological fracture, other site, initial encounter for fracture: Secondary | ICD-10-CM | POA: Diagnosis not present

## 2016-07-11 DIAGNOSIS — Z87891 Personal history of nicotine dependence: Secondary | ICD-10-CM | POA: Diagnosis not present

## 2016-07-11 DIAGNOSIS — Z880 Allergy status to penicillin: Secondary | ICD-10-CM | POA: Diagnosis not present

## 2016-07-11 DIAGNOSIS — M545 Low back pain: Secondary | ICD-10-CM | POA: Diagnosis not present

## 2016-07-11 DIAGNOSIS — S3210XA Unspecified fracture of sacrum, initial encounter for closed fracture: Secondary | ICD-10-CM | POA: Diagnosis not present

## 2016-07-11 DIAGNOSIS — I10 Essential (primary) hypertension: Secondary | ICD-10-CM | POA: Diagnosis not present

## 2016-07-11 HISTORY — PX: IR SACROPLASTY BILATERAL: IMG5561

## 2016-07-11 MED ORDER — HYDROMORPHONE HCL 1 MG/ML IJ SOLN
INTRAMUSCULAR | Status: AC
  Filled 2016-07-11: qty 1

## 2016-07-11 MED ORDER — IOPAMIDOL (ISOVUE-300) INJECTION 61%
INTRAVENOUS | Status: AC
  Administered 2016-07-11: 3 mL
  Filled 2016-07-11: qty 50

## 2016-07-11 MED ORDER — SODIUM CHLORIDE 0.9 % IV SOLN
INTRAVENOUS | Status: AC
  Administered 2016-07-11: 13:00:00 via INTRAVENOUS

## 2016-07-11 MED ORDER — GELATIN ABSORBABLE 12-7 MM EX MISC
CUTANEOUS | Status: AC | PRN
  Administered 2016-07-11: 1 via TOPICAL

## 2016-07-11 MED ORDER — MIDAZOLAM HCL 2 MG/2ML IJ SOLN
INTRAMUSCULAR | Status: DC
Start: 2016-07-11 — End: 2016-07-11
  Filled 2016-07-11: qty 2

## 2016-07-11 MED ORDER — FENTANYL CITRATE (PF) 100 MCG/2ML IJ SOLN
INTRAMUSCULAR | Status: AC
  Filled 2016-07-11: qty 2

## 2016-07-11 MED ORDER — SODIUM CHLORIDE 0.9 % IV SOLN
INTRAVENOUS | Status: AC | PRN
  Administered 2016-07-11: 10 mL/h via INTRAVENOUS

## 2016-07-11 MED ORDER — MIDAZOLAM HCL 2 MG/2ML IJ SOLN
INTRAMUSCULAR | Status: AC | PRN
  Administered 2016-07-11: 1 mg via INTRAVENOUS

## 2016-07-11 MED ORDER — FENTANYL CITRATE (PF) 100 MCG/2ML IJ SOLN
INTRAMUSCULAR | Status: AC | PRN
  Administered 2016-07-11: 25 ug via INTRAVENOUS

## 2016-07-11 MED ORDER — GELATIN ABSORBABLE 12-7 MM EX MISC
CUTANEOUS | Status: AC
  Filled 2016-07-11: qty 1

## 2016-07-11 MED ORDER — TOBRAMYCIN SULFATE 1.2 G IJ SOLR
INTRAMUSCULAR | Status: AC
  Filled 2016-07-11: qty 1.2

## 2016-07-11 MED ORDER — TOBRAMYCIN SULFATE 1.2 G IJ SOLR
INTRAMUSCULAR | Status: AC | PRN
  Administered 2016-07-11: .001 g via TOPICAL

## 2016-07-11 MED ORDER — MIDAZOLAM HCL 2 MG/2ML IJ SOLN
INTRAMUSCULAR | Status: AC
  Filled 2016-07-11: qty 4

## 2016-07-11 MED ORDER — VANCOMYCIN HCL IN DEXTROSE 1-5 GM/200ML-% IV SOLN
1000.0000 mg | INTRAVENOUS | Status: AC
  Administered 2016-07-11: 1000 mg via INTRAVENOUS

## 2016-07-11 MED ORDER — BUPIVACAINE HCL (PF) 0.25 % IJ SOLN
INTRAMUSCULAR | Status: AC
  Filled 2016-07-11: qty 30

## 2016-07-11 MED ORDER — FENTANYL CITRATE (PF) 100 MCG/2ML IJ SOLN
INTRAMUSCULAR | Status: AC
  Filled 2016-07-11: qty 4

## 2016-07-11 MED ORDER — VANCOMYCIN HCL IN DEXTROSE 1-5 GM/200ML-% IV SOLN
INTRAVENOUS | Status: AC
  Filled 2016-07-11: qty 200

## 2016-07-11 MED ORDER — BUPIVACAINE HCL (PF) 0.25 % IJ SOLN
INTRAMUSCULAR | Status: AC | PRN
  Administered 2016-07-11: 30 mL

## 2016-07-11 MED ORDER — SODIUM CHLORIDE 0.9 % IV SOLN: INTRAVENOUS | Status: DC

## 2016-07-11 NOTE — Discharge Instructions (Addendum)
Percutaneous Vertebroplasty, Care After Refer to this sheet in the next few weeks. These instructions provide you with information on caring for yourself after your procedure. Your health care provider may also give you more specific instructions. Your treatment has been planned according to current medical practices, but problems sometimes occur. Call your health care provider if you have any problems or questions after your procedure. What can I expect after the procedure?  You may have discomfort in your back at the site of the procedure.  You may have immediate relief of the back pain from the collapsed bones of the spine (compression fracture), or it may lessen over the next 2 days.  Pain resulting from a percutaneous vertebroplasty will usually go away in 2-3 days. Follow these instructions at home:  Take pain medicine as directed by your health care provider.  Keep the incision site dry and covered for 24 hours, or as told by your health care provider. Ask your health care provider when you may remove your bandage, as well as when you can bathe or shower.  An ice pack can be placed on the incision site to help lessen pain and swelling after the procedure: ? Put ice in a plastic bag.  ? Place a towel between your skin and the bag. ? Leave the ice on for 20 minutes, 2-3 times a day.  Rest for 24 hours after the procedure or as told by your health care provider.  Return to normal activity as told by your health care provider.  Ask what type of stretching and strengthening exercises you should do after the procedure.  Do not bend or lift anything heavy. Follow your health care provider's instructions about bending and lifting. Contact a health care provider if:  Your incision site becomes red, swollen, or tender to the touch.  You have any type of bleeding or fluid leakage from the incision site.  You become nauseous or vomit for more than 24 hours after the procedure.  Your  back pain does not get better.  You have a fever. Get help right away if:  You have sudden, severe back pain.  You notice a change in your ability to control urination or bowel movements.  You have tingling, numbness, or weakness in your legs or feet after the procedure that was not there before.  You have sudden weakness in your arms or legs.  You have shooting pain down your legs.  You have chest pain, trouble breathing, or are short of breath.  You feel dizzy or faint.  You have changes in your speech or vision. This information is not intended to replace advice given to you by your health care provider. Make sure you discuss any questions you have with your health care provider. Document Released: 09/05/2010 Document Revised: 06/14/2015 Document Reviewed: 09/13/2012 Elsevier Interactive Patient Education  2017 [employer]. Moderate Conscious Sedation, Adult, Care After These instructions provide you with information about caring for yourself after your procedure. Your health care provider may also give you more specific instructions. Your treatment has been planned according to current medical practices, but problems sometimes occur. Call your health care provider if you have any problems or questions after your procedure. What can I expect after the procedure? After your procedure, it is common:  To feel sleepy for several hours.  To feel clumsy and have poor balance for several hours.  To have poor judgment for several hours.  To vomit if you eat too soon.  Follow  these instructions at home: For at least 24 hours after the procedure:   Do not: ? Participate in activities where you could fall or become injured. ? Drive. ? Use heavy machinery. ? Drink alcohol. ? Take sleeping pills or medicines that cause drowsiness. ? Make important decisions or sign legal documents. ? Take care of children on your own.  Rest. Eating and drinking  Follow the diet recommended  by your health care provider.  If you vomit: ? Drink water, juice, or soup when you can drink without vomiting. ? Make sure you have little or no nausea before eating solid foods. General instructions  Have a responsible adult stay with you until you are awake and alert.  Take over-the-counter and prescription medicines only as told by your health care provider.  If you smoke, do not smoke without supervision.  Keep all follow-up visits as told by your health care provider. This is important. Contact a health care provider if:  You keep feeling nauseous or you keep vomiting.  You feel light-headed.  You develop a rash.  You have a fever. Get help right away if:  You have trouble breathing. This information is not intended to replace advice given to you by your health care provider. Make sure you discuss any questions you have with your health care provider. Document Released: 10/27/2012 Document Revised: 06/11/2015 Document Reviewed: 04/28/2015 Elsevier Interactive Patient Education  [employer].

## 2016-07-11 NOTE — H&P (Signed)
Chief Complaint: Patient was seen in consultation today for sacroplasty at the request of Dr Sarina Ser  Referring Physician(s): Dr Sarina Ser  Supervising Physician: Luanne Bras  Patient Status: [facility] - Out-pt  History of Present Illness: Darlene Ross is a 76 y.o. female   Painful back and buttock area for weeks Denies injury Dr Estanislado Pandy note: 06/26/16: The patient's recent MRI scan of the lumbosacral spine was reviewed with her and her family. 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  Scheduled now for Sacroplasty Last lovenox dose 07/04/16 (prophylactic dose in Rehab facility for back pain)  ** pt was scheduled for sacroplasty 07/08/16 Dr Estanislado Pandy was unable to perform secondary emergent cases--- Now rescheduled to today**  Past Medical History:  Diagnosis Date  . Hypertension     Past Surgical History:  Procedure Laterality Date  . IR RADIOLOGIST EVAL & MGMT  06/26/2016    Allergies: Penicillins  Medications: Prior to Admission medications   Medication Sig Start Date End Date Taking? Authorizing Provider  acetaminophen (TYLENOL) 325 MG tablet Take 650 mg by mouth 4 (four) times daily.    [provider]  alum & mag hydroxide-simeth (GERI-LANTA) 200-200-20 MG/5ML suspension Take 30 mLs by mouth every 3 (three) hours as needed for indigestion or heartburn.    [provider]  calcium-vitamin D (OSCAL WITH D) 500-200 MG-UNIT tablet Take 1 tablet by mouth daily.    [provider]  Cholecalciferol (VITAMIN D3) 1000 units CAPS Take 2,000 Units by mouth daily.    [provider]  enoxaparin (LOVENOX) 40 MG/0.4ML injection Inject 40 mg into the skin daily. 06/17/16   [provider]  Liniments (SALONPAS EX) Apply 1 patch topically daily. Remove in the evening.    [provider]  magnesium hydroxide (MILK OF MAGNESIA) 400 MG/5ML suspension Take 30 mLs by  mouth daily as needed for mild constipation.    [provider]  Multiple Vitamins-Minerals (PRESERVISION AREDS PO) Take 1 capsule by mouth daily.    [provider]  nabumetone (RELAFEN) 750 MG tablet Take 750 mg by mouth 2 (two) times daily.    [provider]  omeprazole (PRILOSEC) 20 MG capsule Take 20 mg by mouth daily.    [provider]  oxyCODONE (OXY IR/ROXICODONE) 5 MG immediate release tablet Take 7.5 mg by mouth See admin instructions. Take 7.5 mg by mouth 30 minutes prior to therapy, scheduled time is at 0715. Take 7.5 mg by mouth every 3 hours as needed for pain. Pain scale; 0= no pain, 1-3= mild pain, 4-6= moderate pain, 7-9= severe pain, 10= unbearable pain    [provider]  polyethylene glycol (MIRALAX / GLYCOLAX) packet Take 17 g by mouth daily as needed for moderate constipation.    [provider]  senna-docusate (SENOKOT S) 8.6-50 MG tablet Take 2 tablets by mouth 2 (two) times daily as needed for mild constipation.    [provider]  valsartan-hydrochlorothiazide (DIOVAN-HCT) 160-12.5 MG tablet Take 1 tablet by mouth daily.    [provider]     History reviewed. No pertinent family history.  Social History   Social History  . Marital status: Widowed    Spouse name: N/A  . Number of children: N/A  . Years of education: N/A   Social History Main Topics  . Smoking status: Former Smoker    Quit date: 07/08/1988  . Smokeless tobacco: None  . Alcohol  use None  . Drug use: Unknown  . Sexual activity: Not Asked   Other Topics Concern  . None   Social History Narrative  . None    Review of Systems: A 12 point ROS discussed and pertinent positives are indicated in the HPI above.  All other systems are negative.  Review of Systems  Constitutional: Positive for activity change. Negative for fatigue.  Respiratory: Negative for cough and shortness of breath.   Cardiovascular: Negative for chest  pain.  Musculoskeletal: Positive for back pain and gait problem.  Neurological: Positive for weakness.  Psychiatric/Behavioral: Negative for behavioral problems and confusion.    Vital Signs: There were no vitals taken for this visit.  Physical Exam  Constitutional: She is oriented to person, place, and time.  Cardiovascular: Normal rate and regular rhythm.   Pulmonary/Chest: Effort normal and breath sounds normal.  Abdominal: Soft. Bowel sounds are normal.  Musculoskeletal: Normal range of motion.  Neurological: She is alert and oriented to person, place, and time.  Skin: Skin is warm and dry.  Psychiatric: She has a normal mood and affect. Her behavior is normal. Judgment and thought content normal.  Nursing note and vitals reviewed.   Mallampati Score:  MD Evaluation Airway: WNL Heart: WNL Abdomen: WNL Chest/ Lungs: WNL ASA  Classification: 3 Mallampati/Airway Score: One  Imaging: Ir Radiologist Eval & Mgmt  Result Date: 07/01/2016 EXAM: NEW PATIENT OFFICE VISIT CHIEF COMPLAINT: Severe low back and buttock pain secondary to sacral insufficiency fractures. Current Pain Level: 1-10 HISTORY OF PRESENT ILLNESS: The patient is a 76 year old lady who has been referred for evaluation and treatment of severely painful sacral insufficiency fractures. The patient is accompanied by her daughter. The patient apparently started experiencing severe pain in the buttock region some time on May 23rd without a history of injury, stooping, bending or lifting. The patient reports gradual worsening the pain to where the patient has been unable to sit or stand for prolonged periods of time, or to turn in bed. She describes this pain as being a sharp gnawing pain with occasional radiation over the lateral aspect of her left thigh. No associated paresthesias or weakness in her lower extremities noted. No radicular distribution into the toes noted either. The patient reports this pain to be a 9 out of 10  especially while sitting or standing for prolonged periods of time. The pain has impacted her ambulation to where she is now ambulating with a walker. The pain is also interfering with her lifestyle in that she is unable to perform her routine daily activities and also driving. The patient has been participating in gentle physical therapy, walking with a walker and arm strengthening exercises. However, these are limited by the pain. Patient reports mild-to-moderate relief of the pain with Oxy IR which she takes 2 to 3 times a day with partial relief. It enables her to sleep at nighttime. She denies any autonomic dysfunction of her bowel or bladder activities. She denies recent chills, fever or rigors. Her appetite is normal. Weight is steady. She denies any symptoms of dysuria, hematuria or polyuria. She denies recent chest pain, shortness of breath, or palpitations. She denies any coughing or wheezing or coughing up of blood. Denies any constipation or diarrhea or abdominal pain, or bloody stools. Past Medical History: Vertebral body compression fractures in 2017. Osteoporosis. Elevated sed rate, benign essential hypertension. Elevated transaminase levels. Mixed dyslipidemia. Medications: Celebrex. Omeprazole. Calcium carbonate vitamin-D combination tablets. Valsartan hydrochlorothiazide. PreserVision. Aspirin 81 mg a day.  Oxy IR. Tylenol as needed. Allergies: Penicillins cause her to swell up with whelps. Social History: Has 2 children, 1 son and 1 daughter alive and well. She denies drinking alcohol or smoking cigarettes or using illicit chemicals. She is on no special diet. Family History: Mother deceased age 14 Alzheimer's disease, heart problems. Father deceased age 26 of kidney disease. REVIEW OF SYSTEMS: As above. PHYSICAL EXAMINATION: Appears in mild distress on account of her pain. Affect appropriate to the situation. Neurologically grossly intact. No lateralizing cranial nerve, motor, sensory,  coordination difficulties. Station and gait not tested. ASSESSMENT AND PLAN: The patient's recent MRI scan of the lumbosacral spine was reviewed with her and her family. 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 Given the patient's clinical history and examination, it was felt the patient would be appropriate for augmentation of the sacral insufficiency fractures with methylmethacrylate. The procedure, the risks, benefits were all reviewed in detail. Questions were answered to their satisfaction. The patient and her daughter would like to proceed with the procedure. This will be scheduled as early as possible. In the meantime, she has been asked to continue taking her medications as prescribed, and to refrain from lifting weights more than 10 pounds and avoid stooping or bending or sitting on hard surfaces. There were asked to call should they have any concerns or questions. The patient any and the daughter leave with good understanding and agreement with the above management plan. Also patient to stop Lovenox 24 hours prior to the procedure. Electronically Signed   By: Luanne Bras M.D.   On: 06/27/2016 15:53    Labs:  CBC:  Recent Labs  07/08/16 0729  WBC 3.8*  HGB 11.8*  HCT 35.8*  PLT 144*    COAGS:  Recent Labs  07/08/16 0729  INR 1.15  APTT 38*    BMP:  Recent Labs  07/08/16 0729  NA 134*  K 3.5  CL 100*  CO2 25  GLUCOSE 118*  BUN 19  CALCIUM 9.4  CREATININE 0.90  GFRNONAA >60  GFRAA >60    LIVER FUNCTION TESTS: No results for input(s): BILITOT, AST, ALT, ALKPHOS, PROT, ALBUMIN in the last 8760 hours.  TUMOR MARKERS: No results for input(s): AFPTM, CEA, CA199, CHROMGRNA in the last 8760 hours.  Assessment and Plan:  Buttock pain for weeks No injury Outside MRI does reveal acute B sacral insufficiency fractures Scheduled now for B sacroplasty Risks and Benefits discussed with the patient  including, but not limited to education regarding the natural healing process of compression fractures without intervention, bleeding, infection, cement migration which may cause spinal cord damage, paralysis, pulmonary embolism or even death. All of the patient's questions were answered, patient is agreeable to proceed. Consent signed and in chart.  Thank you for this interesting consult.  I greatly enjoyed meeting Darlene Ross and look forward to participating in their care.  A copy of this report was sent to the requesting provider on this date.  Electronically Signed: Lavonia Drafts, PA-C 07/11/2016, 10:58 AM   I spent a total of    25 Minutes in face to face in clinical consultation, greater than 50% of which was counseling/coordinating care for sacroplasty

## 2016-07-11 NOTE — Sedation Documentation (Signed)
Called to give report. Nurse unavailable. Will call back 

## 2016-07-11 NOTE — Procedures (Signed)
S/P sacroplasty via bilateral approach.

## 2016-07-14 ENCOUNTER — Telehealth (HOSPITAL_COMMUNITY): Payer: Self-pay

## 2016-07-14 NOTE — Telephone Encounter (Signed)
Pt is up moving around per nursing home. Working with therapy. Will call if things change. AW

## 2016-07-15 ENCOUNTER — Encounter (HOSPITAL_COMMUNITY): Payer: Self-pay | Admitting: Interventional Radiology

## 2016-07-15 ENCOUNTER — Encounter (HOSPITAL_COMMUNITY): Payer: Self-pay | Admitting: Radiology

## 2016-07-19 DIAGNOSIS — M8000XD Age-related osteoporosis with current pathological fracture, unspecified site, subsequent encounter for fracture with routine healing: Secondary | ICD-10-CM | POA: Diagnosis not present

## 2016-07-19 DIAGNOSIS — M4854XS Collapsed vertebra, not elsewhere classified, thoracic region, sequela of fracture: Secondary | ICD-10-CM | POA: Diagnosis not present

## 2016-07-19 DIAGNOSIS — M479 Spondylosis, unspecified: Secondary | ICD-10-CM | POA: Diagnosis not present

## 2016-07-19 DIAGNOSIS — D649 Anemia, unspecified: Secondary | ICD-10-CM | POA: Diagnosis not present

## 2016-07-19 DIAGNOSIS — M8448XD Pathological fracture, other site, subsequent encounter for fracture with routine healing: Secondary | ICD-10-CM | POA: Diagnosis not present

## 2016-07-19 DIAGNOSIS — Z87440 Personal history of urinary (tract) infections: Secondary | ICD-10-CM | POA: Diagnosis not present

## 2016-07-19 DIAGNOSIS — M549 Dorsalgia, unspecified: Secondary | ICD-10-CM | POA: Diagnosis not present

## 2016-07-19 DIAGNOSIS — K5909 Other constipation: Secondary | ICD-10-CM | POA: Diagnosis not present

## 2016-07-20 DIAGNOSIS — M8000XD Age-related osteoporosis with current pathological fracture, unspecified site, subsequent encounter for fracture with routine healing: Secondary | ICD-10-CM | POA: Diagnosis not present

## 2016-07-20 DIAGNOSIS — K5909 Other constipation: Secondary | ICD-10-CM | POA: Diagnosis not present

## 2016-07-20 DIAGNOSIS — M479 Spondylosis, unspecified: Secondary | ICD-10-CM | POA: Diagnosis not present

## 2016-07-20 DIAGNOSIS — M4854XS Collapsed vertebra, not elsewhere classified, thoracic region, sequela of fracture: Secondary | ICD-10-CM | POA: Diagnosis not present

## 2016-07-20 DIAGNOSIS — M8448XD Pathological fracture, other site, subsequent encounter for fracture with routine healing: Secondary | ICD-10-CM | POA: Diagnosis not present

## 2016-07-20 DIAGNOSIS — M549 Dorsalgia, unspecified: Secondary | ICD-10-CM | POA: Diagnosis not present

## 2016-07-22 DIAGNOSIS — M8448XD Pathological fracture, other site, subsequent encounter for fracture with routine healing: Secondary | ICD-10-CM | POA: Diagnosis not present

## 2016-07-22 DIAGNOSIS — K5909 Other constipation: Secondary | ICD-10-CM | POA: Diagnosis not present

## 2016-07-22 DIAGNOSIS — M479 Spondylosis, unspecified: Secondary | ICD-10-CM | POA: Diagnosis not present

## 2016-07-22 DIAGNOSIS — M549 Dorsalgia, unspecified: Secondary | ICD-10-CM | POA: Diagnosis not present

## 2016-07-22 DIAGNOSIS — M4854XS Collapsed vertebra, not elsewhere classified, thoracic region, sequela of fracture: Secondary | ICD-10-CM | POA: Diagnosis not present

## 2016-07-22 DIAGNOSIS — M8000XD Age-related osteoporosis with current pathological fracture, unspecified site, subsequent encounter for fracture with routine healing: Secondary | ICD-10-CM | POA: Diagnosis not present

## 2016-07-25 DIAGNOSIS — M479 Spondylosis, unspecified: Secondary | ICD-10-CM | POA: Diagnosis not present

## 2016-07-25 DIAGNOSIS — M4854XS Collapsed vertebra, not elsewhere classified, thoracic region, sequela of fracture: Secondary | ICD-10-CM | POA: Diagnosis not present

## 2016-07-25 DIAGNOSIS — M8000XD Age-related osteoporosis with current pathological fracture, unspecified site, subsequent encounter for fracture with routine healing: Secondary | ICD-10-CM | POA: Diagnosis not present

## 2016-07-25 DIAGNOSIS — M549 Dorsalgia, unspecified: Secondary | ICD-10-CM | POA: Diagnosis not present

## 2016-07-25 DIAGNOSIS — M8448XD Pathological fracture, other site, subsequent encounter for fracture with routine healing: Secondary | ICD-10-CM | POA: Diagnosis not present

## 2016-07-25 DIAGNOSIS — K5909 Other constipation: Secondary | ICD-10-CM | POA: Diagnosis not present

## 2016-07-28 DIAGNOSIS — M479 Spondylosis, unspecified: Secondary | ICD-10-CM | POA: Diagnosis not present

## 2016-07-28 DIAGNOSIS — M8448XD Pathological fracture, other site, subsequent encounter for fracture with routine healing: Secondary | ICD-10-CM | POA: Diagnosis not present

## 2016-07-28 DIAGNOSIS — M549 Dorsalgia, unspecified: Secondary | ICD-10-CM | POA: Diagnosis not present

## 2016-07-28 DIAGNOSIS — M4854XS Collapsed vertebra, not elsewhere classified, thoracic region, sequela of fracture: Secondary | ICD-10-CM | POA: Diagnosis not present

## 2016-07-28 DIAGNOSIS — M8000XD Age-related osteoporosis with current pathological fracture, unspecified site, subsequent encounter for fracture with routine healing: Secondary | ICD-10-CM | POA: Diagnosis not present

## 2016-07-28 DIAGNOSIS — K5909 Other constipation: Secondary | ICD-10-CM | POA: Diagnosis not present

## 2016-07-30 DIAGNOSIS — M4854XS Collapsed vertebra, not elsewhere classified, thoracic region, sequela of fracture: Secondary | ICD-10-CM | POA: Diagnosis not present

## 2016-07-30 DIAGNOSIS — M549 Dorsalgia, unspecified: Secondary | ICD-10-CM | POA: Diagnosis not present

## 2016-07-30 DIAGNOSIS — M8000XD Age-related osteoporosis with current pathological fracture, unspecified site, subsequent encounter for fracture with routine healing: Secondary | ICD-10-CM | POA: Diagnosis not present

## 2016-07-30 DIAGNOSIS — M479 Spondylosis, unspecified: Secondary | ICD-10-CM | POA: Diagnosis not present

## 2016-07-30 DIAGNOSIS — M8448XD Pathological fracture, other site, subsequent encounter for fracture with routine healing: Secondary | ICD-10-CM | POA: Diagnosis not present

## 2016-07-30 DIAGNOSIS — K5909 Other constipation: Secondary | ICD-10-CM | POA: Diagnosis not present

## 2016-08-01 DIAGNOSIS — M8000XD Age-related osteoporosis with current pathological fracture, unspecified site, subsequent encounter for fracture with routine healing: Secondary | ICD-10-CM | POA: Diagnosis not present

## 2016-08-01 DIAGNOSIS — M8448XD Pathological fracture, other site, subsequent encounter for fracture with routine healing: Secondary | ICD-10-CM | POA: Diagnosis not present

## 2016-08-01 DIAGNOSIS — K5909 Other constipation: Secondary | ICD-10-CM | POA: Diagnosis not present

## 2016-08-01 DIAGNOSIS — M4854XS Collapsed vertebra, not elsewhere classified, thoracic region, sequela of fracture: Secondary | ICD-10-CM | POA: Diagnosis not present

## 2016-08-01 DIAGNOSIS — M479 Spondylosis, unspecified: Secondary | ICD-10-CM | POA: Diagnosis not present

## 2016-08-01 DIAGNOSIS — M549 Dorsalgia, unspecified: Secondary | ICD-10-CM | POA: Diagnosis not present

## 2016-08-04 DIAGNOSIS — K5909 Other constipation: Secondary | ICD-10-CM | POA: Diagnosis not present

## 2016-08-04 DIAGNOSIS — M4854XS Collapsed vertebra, not elsewhere classified, thoracic region, sequela of fracture: Secondary | ICD-10-CM | POA: Diagnosis not present

## 2016-08-04 DIAGNOSIS — M549 Dorsalgia, unspecified: Secondary | ICD-10-CM | POA: Diagnosis not present

## 2016-08-04 DIAGNOSIS — M479 Spondylosis, unspecified: Secondary | ICD-10-CM | POA: Diagnosis not present

## 2016-08-04 DIAGNOSIS — M8000XD Age-related osteoporosis with current pathological fracture, unspecified site, subsequent encounter for fracture with routine healing: Secondary | ICD-10-CM | POA: Diagnosis not present

## 2016-08-04 DIAGNOSIS — M8448XD Pathological fracture, other site, subsequent encounter for fracture with routine healing: Secondary | ICD-10-CM | POA: Diagnosis not present

## 2016-08-06 DIAGNOSIS — M810 Age-related osteoporosis without current pathological fracture: Secondary | ICD-10-CM | POA: Diagnosis not present

## 2016-08-06 DIAGNOSIS — Z681 Body mass index (BMI) 19 or less, adult: Secondary | ICD-10-CM | POA: Diagnosis not present

## 2016-08-06 DIAGNOSIS — M8448XS Pathological fracture, other site, sequela: Secondary | ICD-10-CM | POA: Diagnosis not present

## 2016-08-06 DIAGNOSIS — I10 Essential (primary) hypertension: Secondary | ICD-10-CM | POA: Diagnosis not present

## 2016-08-08 DIAGNOSIS — R600 Localized edema: Secondary | ICD-10-CM | POA: Diagnosis not present

## 2016-08-12 DIAGNOSIS — M549 Dorsalgia, unspecified: Secondary | ICD-10-CM | POA: Diagnosis not present

## 2016-08-12 DIAGNOSIS — K5909 Other constipation: Secondary | ICD-10-CM | POA: Diagnosis not present

## 2016-08-12 DIAGNOSIS — M8448XD Pathological fracture, other site, subsequent encounter for fracture with routine healing: Secondary | ICD-10-CM | POA: Diagnosis not present

## 2016-08-12 DIAGNOSIS — M479 Spondylosis, unspecified: Secondary | ICD-10-CM | POA: Diagnosis not present

## 2016-08-12 DIAGNOSIS — M4854XS Collapsed vertebra, not elsewhere classified, thoracic region, sequela of fracture: Secondary | ICD-10-CM | POA: Diagnosis not present

## 2016-08-12 DIAGNOSIS — M8000XD Age-related osteoporosis with current pathological fracture, unspecified site, subsequent encounter for fracture with routine healing: Secondary | ICD-10-CM | POA: Diagnosis not present

## 2016-08-14 DIAGNOSIS — D649 Anemia, unspecified: Secondary | ICD-10-CM | POA: Diagnosis not present

## 2016-08-18 DIAGNOSIS — K2970 Gastritis, unspecified, without bleeding: Secondary | ICD-10-CM | POA: Diagnosis not present

## 2016-08-18 DIAGNOSIS — D509 Iron deficiency anemia, unspecified: Secondary | ICD-10-CM | POA: Diagnosis not present

## 2016-08-18 DIAGNOSIS — K644 Residual hemorrhoidal skin tags: Secondary | ICD-10-CM | POA: Diagnosis not present

## 2016-08-18 DIAGNOSIS — K5730 Diverticulosis of large intestine without perforation or abscess without bleeding: Secondary | ICD-10-CM | POA: Diagnosis not present

## 2016-10-31 DIAGNOSIS — Q2733 Arteriovenous malformation of digestive system vessel: Secondary | ICD-10-CM | POA: Diagnosis not present

## 2016-10-31 DIAGNOSIS — I10 Essential (primary) hypertension: Secondary | ICD-10-CM | POA: Diagnosis not present

## 2016-10-31 DIAGNOSIS — D649 Anemia, unspecified: Secondary | ICD-10-CM | POA: Diagnosis not present

## 2016-10-31 DIAGNOSIS — Z23 Encounter for immunization: Secondary | ICD-10-CM | POA: Diagnosis not present

## 2016-10-31 DIAGNOSIS — R609 Edema, unspecified: Secondary | ICD-10-CM | POA: Diagnosis not present

## 2016-10-31 DIAGNOSIS — R700 Elevated erythrocyte sedimentation rate: Secondary | ICD-10-CM | POA: Diagnosis not present

## 2017-02-11 DIAGNOSIS — D649 Anemia, unspecified: Secondary | ICD-10-CM | POA: Diagnosis not present

## 2017-02-11 DIAGNOSIS — L570 Actinic keratosis: Secondary | ICD-10-CM | POA: Diagnosis not present

## 2017-02-17 DIAGNOSIS — H26492 Other secondary cataract, left eye: Secondary | ICD-10-CM | POA: Diagnosis not present

## 2017-02-17 DIAGNOSIS — H353132 Nonexudative age-related macular degeneration, bilateral, intermediate dry stage: Secondary | ICD-10-CM | POA: Diagnosis not present

## 2017-02-17 DIAGNOSIS — H52223 Regular astigmatism, bilateral: Secondary | ICD-10-CM | POA: Diagnosis not present

## 2017-02-17 DIAGNOSIS — H524 Presbyopia: Secondary | ICD-10-CM | POA: Diagnosis not present

## 2017-02-17 DIAGNOSIS — Z961 Presence of intraocular lens: Secondary | ICD-10-CM | POA: Diagnosis not present

## 2017-02-17 DIAGNOSIS — H26491 Other secondary cataract, right eye: Secondary | ICD-10-CM | POA: Diagnosis not present

## 2017-02-26 DIAGNOSIS — H353132 Nonexudative age-related macular degeneration, bilateral, intermediate dry stage: Secondary | ICD-10-CM | POA: Diagnosis not present

## 2017-04-18 DIAGNOSIS — J189 Pneumonia, unspecified organism: Secondary | ICD-10-CM | POA: Diagnosis not present

## 2017-04-18 DIAGNOSIS — R05 Cough: Secondary | ICD-10-CM | POA: Diagnosis not present

## 2017-04-28 DIAGNOSIS — I10 Essential (primary) hypertension: Secondary | ICD-10-CM | POA: Diagnosis not present

## 2017-04-28 DIAGNOSIS — Z6820 Body mass index (BMI) 20.0-20.9, adult: Secondary | ICD-10-CM | POA: Diagnosis not present

## 2017-04-28 DIAGNOSIS — J9801 Acute bronchospasm: Secondary | ICD-10-CM | POA: Diagnosis not present

## 2017-04-28 DIAGNOSIS — M810 Age-related osteoporosis without current pathological fracture: Secondary | ICD-10-CM | POA: Diagnosis not present

## 2017-04-28 DIAGNOSIS — Z0000 Encounter for general adult medical examination without abnormal findings: Secondary | ICD-10-CM | POA: Diagnosis not present

## 2017-05-05 DIAGNOSIS — Z79899 Other long term (current) drug therapy: Secondary | ICD-10-CM | POA: Diagnosis not present

## 2017-05-19 DIAGNOSIS — M810 Age-related osteoporosis without current pathological fracture: Secondary | ICD-10-CM | POA: Diagnosis not present

## 2017-05-28 DIAGNOSIS — H353132 Nonexudative age-related macular degeneration, bilateral, intermediate dry stage: Secondary | ICD-10-CM | POA: Diagnosis not present

## 2017-08-09 DIAGNOSIS — R21 Rash and other nonspecific skin eruption: Secondary | ICD-10-CM | POA: Diagnosis not present

## 2017-08-09 DIAGNOSIS — B029 Zoster without complications: Secondary | ICD-10-CM | POA: Diagnosis not present

## 2017-08-21 ENCOUNTER — Other Ambulatory Visit: Payer: Self-pay

## 2017-09-03 DIAGNOSIS — B0229 Other postherpetic nervous system involvement: Secondary | ICD-10-CM | POA: Diagnosis not present

## 2017-09-03 DIAGNOSIS — Z6820 Body mass index (BMI) 20.0-20.9, adult: Secondary | ICD-10-CM | POA: Diagnosis not present

## 2017-09-03 DIAGNOSIS — K219 Gastro-esophageal reflux disease without esophagitis: Secondary | ICD-10-CM | POA: Diagnosis not present

## 2017-09-30 DIAGNOSIS — R509 Fever, unspecified: Secondary | ICD-10-CM | POA: Diagnosis not present

## 2017-09-30 DIAGNOSIS — Z681 Body mass index (BMI) 19 or less, adult: Secondary | ICD-10-CM | POA: Diagnosis not present

## 2017-09-30 DIAGNOSIS — R110 Nausea: Secondary | ICD-10-CM | POA: Diagnosis not present

## 2017-10-01 DIAGNOSIS — R509 Fever, unspecified: Secondary | ICD-10-CM | POA: Diagnosis not present

## 2017-10-22 DIAGNOSIS — D649 Anemia, unspecified: Secondary | ICD-10-CM | POA: Diagnosis not present

## 2017-12-10 DIAGNOSIS — Z1331 Encounter for screening for depression: Secondary | ICD-10-CM | POA: Diagnosis not present

## 2017-12-10 DIAGNOSIS — Z681 Body mass index (BMI) 19 or less, adult: Secondary | ICD-10-CM | POA: Diagnosis not present

## 2017-12-10 DIAGNOSIS — Z23 Encounter for immunization: Secondary | ICD-10-CM | POA: Diagnosis not present

## 2017-12-10 DIAGNOSIS — H6122 Impacted cerumen, left ear: Secondary | ICD-10-CM | POA: Diagnosis not present

## 2017-12-10 DIAGNOSIS — D61818 Other pancytopenia: Secondary | ICD-10-CM | POA: Diagnosis not present

## 2018-01-07 DIAGNOSIS — D649 Anemia, unspecified: Secondary | ICD-10-CM | POA: Diagnosis not present

## 2018-01-07 DIAGNOSIS — E538 Deficiency of other specified B group vitamins: Secondary | ICD-10-CM | POA: Diagnosis not present

## 2018-01-15 DIAGNOSIS — D509 Iron deficiency anemia, unspecified: Secondary | ICD-10-CM | POA: Diagnosis not present

## 2018-01-15 DIAGNOSIS — D649 Anemia, unspecified: Secondary | ICD-10-CM | POA: Diagnosis not present

## 2018-01-22 DIAGNOSIS — D509 Iron deficiency anemia, unspecified: Secondary | ICD-10-CM | POA: Diagnosis not present

## 2018-01-29 DIAGNOSIS — D519 Vitamin B12 deficiency anemia, unspecified: Secondary | ICD-10-CM | POA: Diagnosis not present

## 2018-01-29 DIAGNOSIS — D509 Iron deficiency anemia, unspecified: Secondary | ICD-10-CM | POA: Diagnosis not present

## 2018-02-01 DIAGNOSIS — D519 Vitamin B12 deficiency anemia, unspecified: Secondary | ICD-10-CM | POA: Diagnosis not present

## 2018-02-08 DIAGNOSIS — D519 Vitamin B12 deficiency anemia, unspecified: Secondary | ICD-10-CM | POA: Diagnosis not present

## 2018-02-15 DIAGNOSIS — D519 Vitamin B12 deficiency anemia, unspecified: Secondary | ICD-10-CM | POA: Diagnosis not present

## 2018-02-15 DIAGNOSIS — D509 Iron deficiency anemia, unspecified: Secondary | ICD-10-CM | POA: Diagnosis not present

## 2018-02-22 DIAGNOSIS — D519 Vitamin B12 deficiency anemia, unspecified: Secondary | ICD-10-CM | POA: Diagnosis not present

## 2018-03-18 DIAGNOSIS — D519 Vitamin B12 deficiency anemia, unspecified: Secondary | ICD-10-CM | POA: Diagnosis not present

## 2018-05-10 DIAGNOSIS — D518 Other vitamin B12 deficiency anemias: Secondary | ICD-10-CM | POA: Diagnosis not present

## 2018-05-10 DIAGNOSIS — E538 Deficiency of other specified B group vitamins: Secondary | ICD-10-CM | POA: Diagnosis not present

## 2018-05-10 DIAGNOSIS — D509 Iron deficiency anemia, unspecified: Secondary | ICD-10-CM | POA: Diagnosis not present

## 2018-10-04 DIAGNOSIS — Z6820 Body mass index (BMI) 20.0-20.9, adult: Secondary | ICD-10-CM | POA: Diagnosis not present

## 2018-10-04 DIAGNOSIS — R062 Wheezing: Secondary | ICD-10-CM | POA: Diagnosis not present

## 2018-10-04 DIAGNOSIS — R509 Fever, unspecified: Secondary | ICD-10-CM | POA: Diagnosis not present

## 2018-10-12 DIAGNOSIS — M8448XS Pathological fracture, other site, sequela: Secondary | ICD-10-CM | POA: Diagnosis not present

## 2018-10-12 DIAGNOSIS — M810 Age-related osteoporosis without current pathological fracture: Secondary | ICD-10-CM | POA: Diagnosis not present

## 2018-10-12 DIAGNOSIS — J449 Chronic obstructive pulmonary disease, unspecified: Secondary | ICD-10-CM | POA: Diagnosis not present

## 2018-10-12 DIAGNOSIS — Z23 Encounter for immunization: Secondary | ICD-10-CM | POA: Diagnosis not present

## 2018-10-12 DIAGNOSIS — I10 Essential (primary) hypertension: Secondary | ICD-10-CM | POA: Diagnosis not present

## 2018-10-26 DIAGNOSIS — Z5181 Encounter for therapeutic drug level monitoring: Secondary | ICD-10-CM | POA: Diagnosis not present

## 2018-11-09 DIAGNOSIS — J9811 Atelectasis: Secondary | ICD-10-CM | POA: Diagnosis not present

## 2018-11-09 DIAGNOSIS — I10 Essential (primary) hypertension: Secondary | ICD-10-CM | POA: Diagnosis not present

## 2018-11-09 DIAGNOSIS — D519 Vitamin B12 deficiency anemia, unspecified: Secondary | ICD-10-CM | POA: Diagnosis not present

## 2018-11-09 DIAGNOSIS — J90 Pleural effusion, not elsewhere classified: Secondary | ICD-10-CM | POA: Diagnosis not present

## 2018-11-09 DIAGNOSIS — D509 Iron deficiency anemia, unspecified: Secondary | ICD-10-CM | POA: Diagnosis not present

## 2018-11-09 DIAGNOSIS — K219 Gastro-esophageal reflux disease without esophagitis: Secondary | ICD-10-CM | POA: Diagnosis not present

## 2018-11-10 DIAGNOSIS — D509 Iron deficiency anemia, unspecified: Secondary | ICD-10-CM | POA: Diagnosis not present

## 2018-11-17 DIAGNOSIS — H353132 Nonexudative age-related macular degeneration, bilateral, intermediate dry stage: Secondary | ICD-10-CM | POA: Diagnosis not present

## 2018-11-18 DIAGNOSIS — D509 Iron deficiency anemia, unspecified: Secondary | ICD-10-CM | POA: Diagnosis not present

## 2018-11-26 DIAGNOSIS — H26491 Other secondary cataract, right eye: Secondary | ICD-10-CM | POA: Diagnosis not present

## 2018-12-02 DIAGNOSIS — S22050A Wedge compression fracture of T5-T6 vertebra, initial encounter for closed fracture: Secondary | ICD-10-CM | POA: Diagnosis not present

## 2018-12-02 DIAGNOSIS — M549 Dorsalgia, unspecified: Secondary | ICD-10-CM | POA: Diagnosis not present

## 2018-12-02 DIAGNOSIS — K2970 Gastritis, unspecified, without bleeding: Secondary | ICD-10-CM | POA: Diagnosis not present

## 2018-12-02 DIAGNOSIS — D500 Iron deficiency anemia secondary to blood loss (chronic): Secondary | ICD-10-CM | POA: Diagnosis not present

## 2018-12-02 DIAGNOSIS — K219 Gastro-esophageal reflux disease without esophagitis: Secondary | ICD-10-CM | POA: Diagnosis not present

## 2018-12-06 ENCOUNTER — Telehealth (HOSPITAL_COMMUNITY): Payer: Self-pay

## 2018-12-06 NOTE — Telephone Encounter (Signed)
Daughter called because pt has a new compression fracture. She only had an xray done in the ED. Informed her that Dr. Estanislado Pandy would need an MRI. She will call PCP to get MRI done send results. I will have Dr. Estanislado Pandy review when done. AW

## 2018-12-10 ENCOUNTER — Other Ambulatory Visit: Payer: Self-pay

## 2018-12-10 DIAGNOSIS — S32010A Wedge compression fracture of first lumbar vertebra, initial encounter for closed fracture: Secondary | ICD-10-CM | POA: Diagnosis not present

## 2018-12-10 DIAGNOSIS — S22080A Wedge compression fracture of T11-T12 vertebra, initial encounter for closed fracture: Secondary | ICD-10-CM | POA: Diagnosis not present

## 2018-12-10 DIAGNOSIS — H26492 Other secondary cataract, left eye: Secondary | ICD-10-CM | POA: Diagnosis not present

## 2018-12-14 DIAGNOSIS — S32010A Wedge compression fracture of first lumbar vertebra, initial encounter for closed fracture: Secondary | ICD-10-CM | POA: Diagnosis not present

## 2018-12-14 DIAGNOSIS — S22080A Wedge compression fracture of T11-T12 vertebra, initial encounter for closed fracture: Secondary | ICD-10-CM | POA: Diagnosis not present

## 2018-12-14 DIAGNOSIS — X58XXXA Exposure to other specified factors, initial encounter: Secondary | ICD-10-CM | POA: Diagnosis not present

## 2018-12-21 DIAGNOSIS — S22080A Wedge compression fracture of T11-T12 vertebra, initial encounter for closed fracture: Secondary | ICD-10-CM | POA: Diagnosis not present

## 2018-12-23 DIAGNOSIS — K5520 Angiodysplasia of colon without hemorrhage: Secondary | ICD-10-CM | POA: Diagnosis not present

## 2018-12-23 DIAGNOSIS — K5730 Diverticulosis of large intestine without perforation or abscess without bleeding: Secondary | ICD-10-CM | POA: Diagnosis not present

## 2018-12-23 DIAGNOSIS — D126 Benign neoplasm of colon, unspecified: Secondary | ICD-10-CM | POA: Diagnosis not present

## 2018-12-23 DIAGNOSIS — D509 Iron deficiency anemia, unspecified: Secondary | ICD-10-CM | POA: Diagnosis not present

## 2018-12-23 DIAGNOSIS — E538 Deficiency of other specified B group vitamins: Secondary | ICD-10-CM | POA: Diagnosis not present

## 2018-12-23 DIAGNOSIS — K635 Polyp of colon: Secondary | ICD-10-CM | POA: Diagnosis not present

## 2018-12-23 DIAGNOSIS — K648 Other hemorrhoids: Secondary | ICD-10-CM | POA: Diagnosis not present

## 2018-12-23 DIAGNOSIS — K2970 Gastritis, unspecified, without bleeding: Secondary | ICD-10-CM | POA: Diagnosis not present

## 2018-12-23 DIAGNOSIS — D696 Thrombocytopenia, unspecified: Secondary | ICD-10-CM | POA: Diagnosis not present

## 2018-12-23 DIAGNOSIS — L538 Other specified erythematous conditions: Secondary | ICD-10-CM | POA: Diagnosis not present

## 2018-12-23 DIAGNOSIS — D500 Iron deficiency anemia secondary to blood loss (chronic): Secondary | ICD-10-CM | POA: Diagnosis not present

## 2018-12-23 DIAGNOSIS — K449 Diaphragmatic hernia without obstruction or gangrene: Secondary | ICD-10-CM | POA: Diagnosis not present

## 2018-12-23 DIAGNOSIS — Q2733 Arteriovenous malformation of digestive system vessel: Secondary | ICD-10-CM | POA: Diagnosis not present

## 2018-12-23 DIAGNOSIS — K319 Disease of stomach and duodenum, unspecified: Secondary | ICD-10-CM | POA: Diagnosis not present

## 2018-12-27 DIAGNOSIS — Z79899 Other long term (current) drug therapy: Secondary | ICD-10-CM | POA: Diagnosis not present

## 2018-12-27 DIAGNOSIS — J302 Other seasonal allergic rhinitis: Secondary | ICD-10-CM | POA: Diagnosis not present

## 2018-12-27 DIAGNOSIS — K219 Gastro-esophageal reflux disease without esophagitis: Secondary | ICD-10-CM | POA: Diagnosis not present

## 2018-12-27 DIAGNOSIS — Z87891 Personal history of nicotine dependence: Secondary | ICD-10-CM | POA: Diagnosis not present

## 2018-12-27 DIAGNOSIS — K5909 Other constipation: Secondary | ICD-10-CM | POA: Diagnosis not present

## 2018-12-27 DIAGNOSIS — M546 Pain in thoracic spine: Secondary | ICD-10-CM | POA: Diagnosis not present

## 2018-12-27 DIAGNOSIS — Z7982 Long term (current) use of aspirin: Secondary | ICD-10-CM | POA: Diagnosis not present

## 2018-12-27 DIAGNOSIS — E7800 Pure hypercholesterolemia, unspecified: Secondary | ICD-10-CM | POA: Diagnosis not present

## 2018-12-27 DIAGNOSIS — S22080A Wedge compression fracture of T11-T12 vertebra, initial encounter for closed fracture: Secondary | ICD-10-CM | POA: Diagnosis not present

## 2018-12-27 DIAGNOSIS — S22050A Wedge compression fracture of T5-T6 vertebra, initial encounter for closed fracture: Secondary | ICD-10-CM | POA: Diagnosis not present

## 2018-12-27 DIAGNOSIS — I10 Essential (primary) hypertension: Secondary | ICD-10-CM | POA: Diagnosis not present

## 2019-01-24 DIAGNOSIS — D509 Iron deficiency anemia, unspecified: Secondary | ICD-10-CM | POA: Diagnosis not present

## 2019-01-24 DIAGNOSIS — D519 Vitamin B12 deficiency anemia, unspecified: Secondary | ICD-10-CM | POA: Diagnosis not present

## 2019-01-24 DIAGNOSIS — D518 Other vitamin B12 deficiency anemias: Secondary | ICD-10-CM | POA: Diagnosis not present

## 2019-03-04 DIAGNOSIS — Z23 Encounter for immunization: Secondary | ICD-10-CM | POA: Diagnosis not present

## 2019-03-17 DIAGNOSIS — Z6820 Body mass index (BMI) 20.0-20.9, adult: Secondary | ICD-10-CM | POA: Diagnosis not present

## 2019-03-17 DIAGNOSIS — L03114 Cellulitis of left upper limb: Secondary | ICD-10-CM | POA: Diagnosis not present

## 2019-03-17 DIAGNOSIS — W5503XA Scratched by cat, initial encounter: Secondary | ICD-10-CM | POA: Diagnosis not present

## 2019-03-17 DIAGNOSIS — Z23 Encounter for immunization: Secondary | ICD-10-CM | POA: Diagnosis not present

## 2019-03-18 DIAGNOSIS — Z681 Body mass index (BMI) 19 or less, adult: Secondary | ICD-10-CM | POA: Diagnosis not present

## 2019-03-18 DIAGNOSIS — L03114 Cellulitis of left upper limb: Secondary | ICD-10-CM | POA: Diagnosis not present

## 2019-03-18 DIAGNOSIS — W5503XA Scratched by cat, initial encounter: Secondary | ICD-10-CM | POA: Diagnosis not present

## 2019-04-01 DIAGNOSIS — Z23 Encounter for immunization: Secondary | ICD-10-CM | POA: Diagnosis not present

## 2019-04-07 DIAGNOSIS — D649 Anemia, unspecified: Secondary | ICD-10-CM | POA: Diagnosis not present

## 2019-04-18 DIAGNOSIS — K2970 Gastritis, unspecified, without bleeding: Secondary | ICD-10-CM | POA: Diagnosis not present

## 2019-04-18 DIAGNOSIS — K5520 Angiodysplasia of colon without hemorrhage: Secondary | ICD-10-CM | POA: Diagnosis not present

## 2019-04-18 DIAGNOSIS — K219 Gastro-esophageal reflux disease without esophagitis: Secondary | ICD-10-CM | POA: Diagnosis not present

## 2019-05-02 DIAGNOSIS — D649 Anemia, unspecified: Secondary | ICD-10-CM | POA: Diagnosis not present

## 2019-05-02 DIAGNOSIS — D510 Vitamin B12 deficiency anemia due to intrinsic factor deficiency: Secondary | ICD-10-CM | POA: Diagnosis not present

## 2019-05-03 IMAGING — XA IR SACRALPLASTY INJ BILAT
1 series · 14 of 24 positions shown · IV contrast (IODINE)
Comparison: MRI of lumbosacral spine of 06/13/2016.

INDICATION: Severe low back and buttock pain secondary to sacral insufficiency
fractures.

EXAM:
BILATERAL SACROPLASTY
MEDICATIONS:
As antibiotic prophylaxis, 1 g Ancef IV was ordered pre-procedure
and administered intravenously within 1 hour of incision.
TECHNIQUE: Informed written consent was obtained from the patient after a
thorough discussion of the procedural risks, benefits and
alternatives. All questions were addressed. Maximal Sterile Barrier
Technique was utilized including caps, mask, sterile gowns, sterile
gloves, sterile drape, hand hygiene and skin antiseptic. A timeout
was performed prior to the initiation of the procedure.

[Series 300: spine · 14 of 30 slices shown]
[im 1/30]
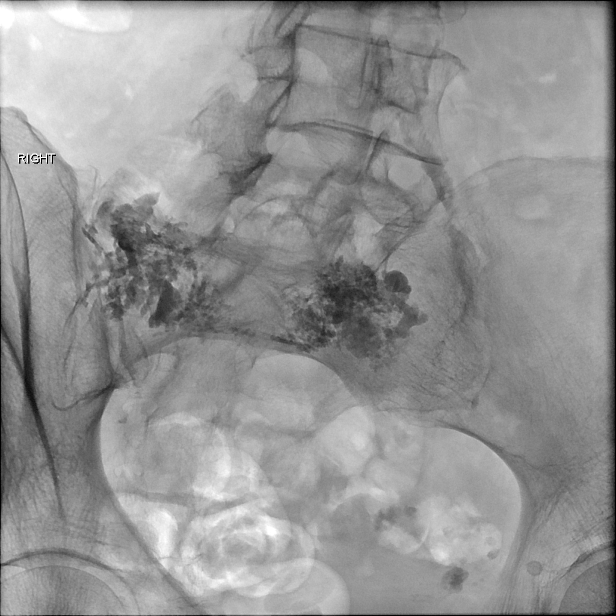
[im 3/30]
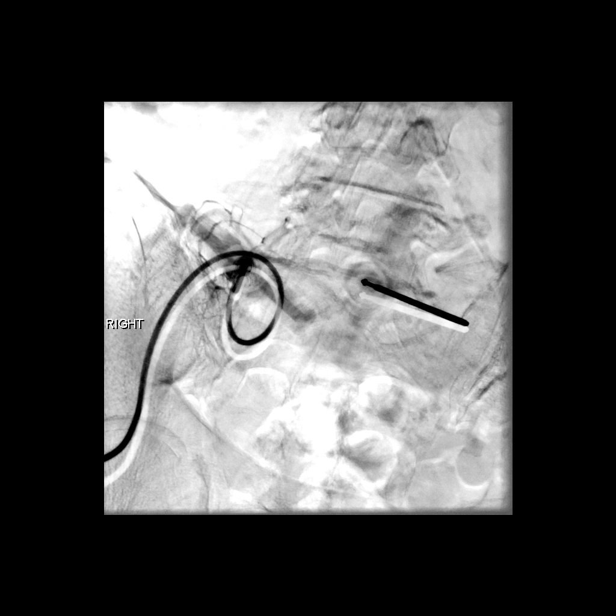
[im 6/30]
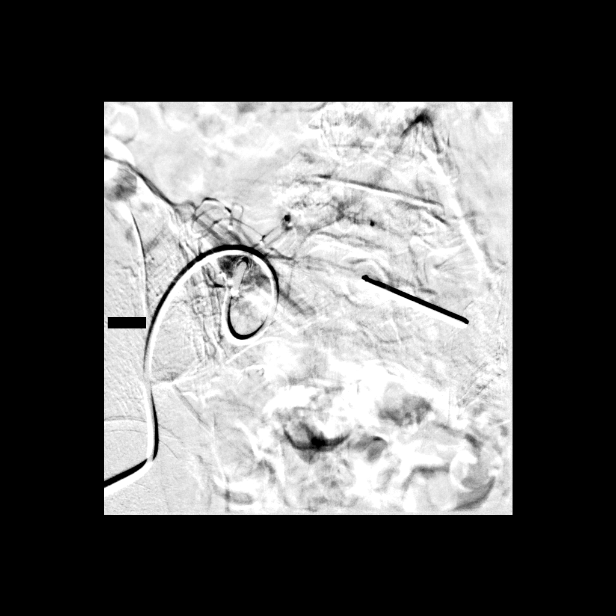
[im 8/30]
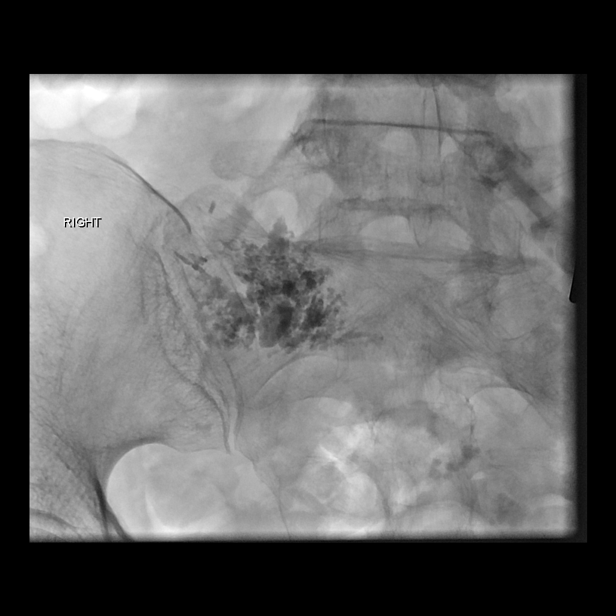
[im 9/30]
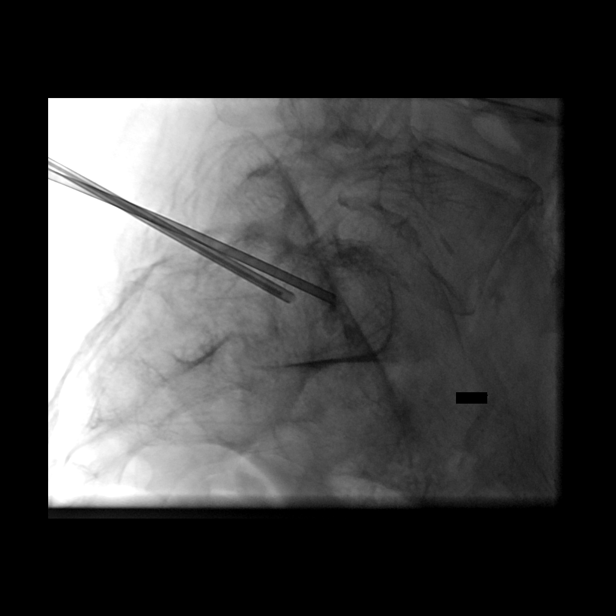
[im 12/30]
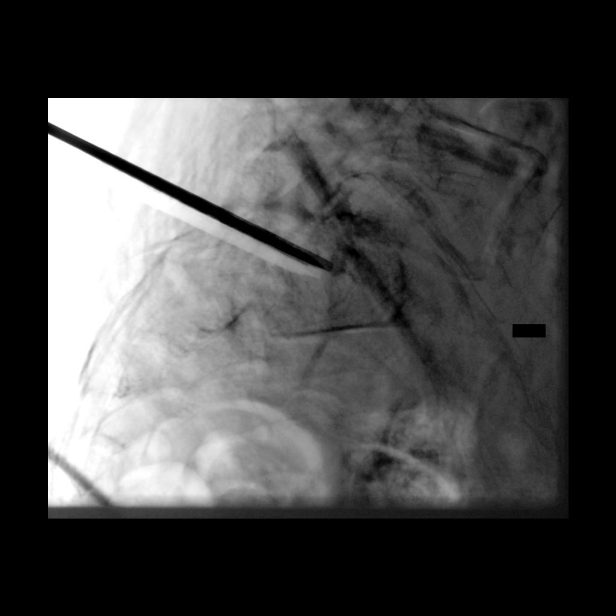
[im 14/30]
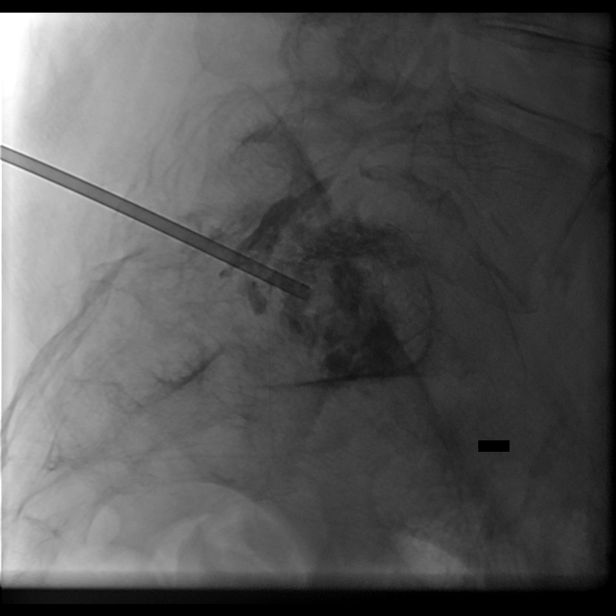
[im 16/30]
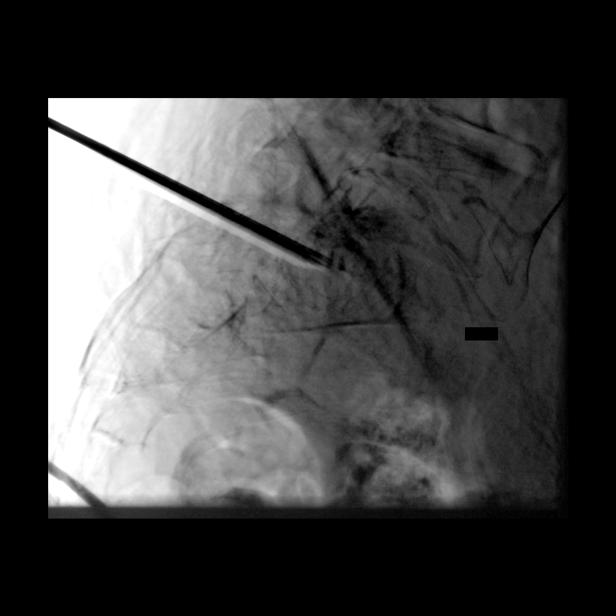
[im 18/30]
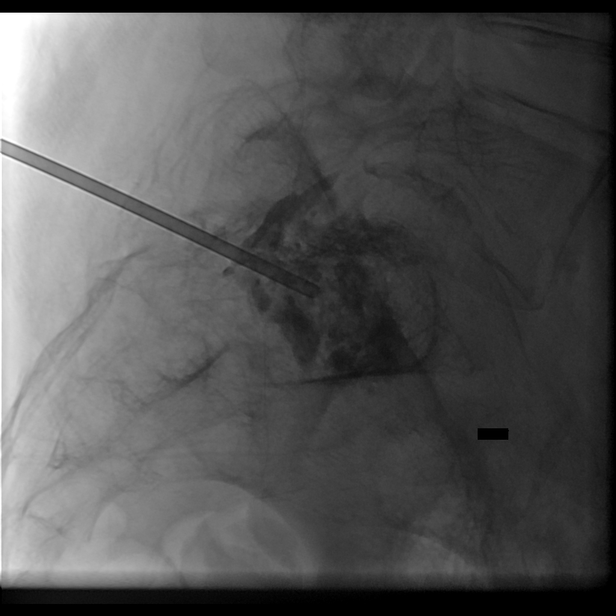
[im 21/30]
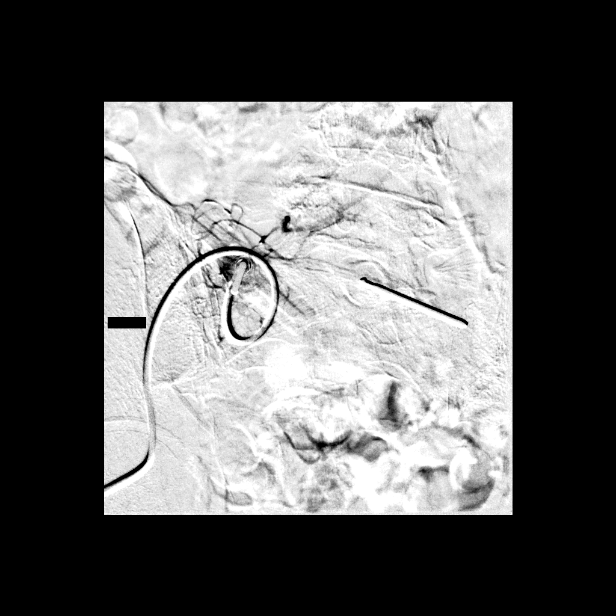
[im 23/30]
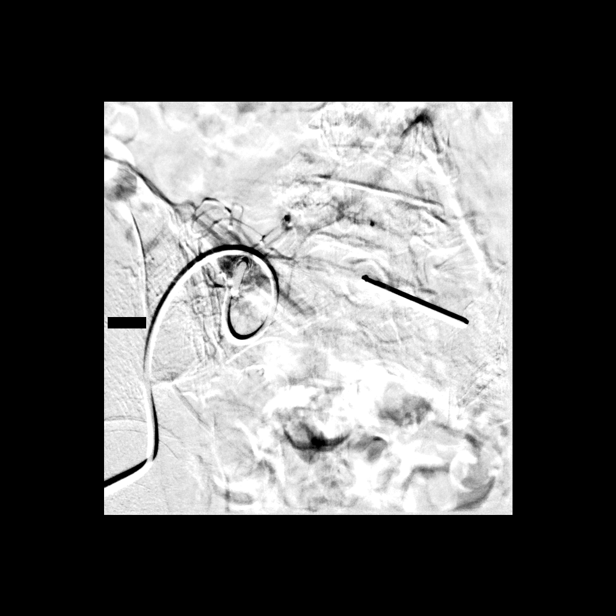
[im 24/30]
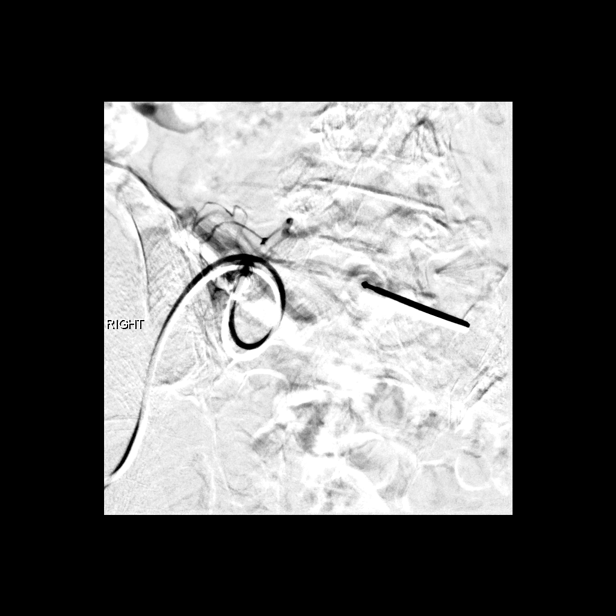
[im 27/30]
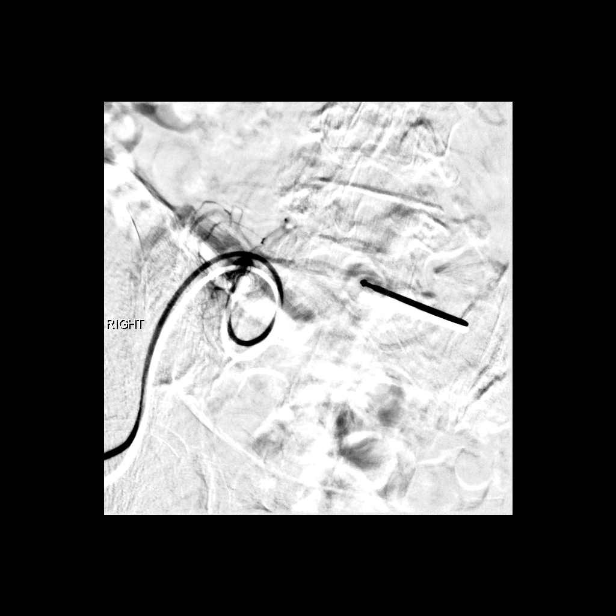
[im 30/30]
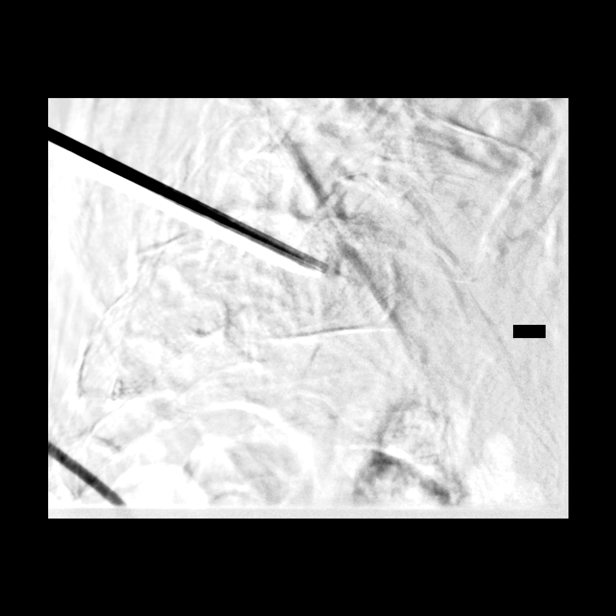

[14 of 24 positions shown; findings below may reference images not displayed]

ANESTHESIA/SEDATION:
Moderate (conscious) sedation was employed during this procedure.

The patient's level of consciousness and vital signs were monitored
continuously by radiology nursing throughout the procedure under my
direct supervision.

Versed 1 mg IV; Fentanyl 25 mcg IV.

Moderate Sedation Time:  30 minutes.

CONTRAST:  Isovue 300 approximately 2 mL.

FLUOROSCOPY TIME:  Fluoroscopy Time: 9 minutes 54 seconds (5815
mGy).

COMPLICATIONS:
None immediate.
PROCEDURE:
The patient was placed prone on the fluoroscopic table. Nasal oxygen
was administered. Physiologic monitoring was performed throughout
the duration of the procedure. The skin overlying the sacral region
was prepped and draped in the usual sterile fashion. The skin entry
points were infiltrated with 0.25% Bupivacaine and carried into the
S1 vertebral body. This was then followed by the advancement of a
13-gauge Cook needles using a bilateral approach into the anterior
one-third at S1. A gentle contrast injection demonstrated a
trabecular pattern of contrast, with early opacification of pelvic
veins. This prompted the use of Gel-Foam pledgets into both the 13
gauge Aujla spinal needles prior to the delivery of
methylmethacrylate mixture.

At this time, methylmethacrylate mixture was reconstituted. Under
biplane intermittent fluoroscopy, the methylmethacrylate was then
injected into the alar of the sacrum bilaterally with filling of the
vertebral body.

No extravasation was noted into the disk spaces or posteriorly into
the spinal canal. No epidural venous contamination was seen.

The needles were then removed. Hemostasis was achieved at the skin
entry sites.

There were no acute complications. Patient tolerated the procedure
well. The patient was observed for 3 hours and discharged in good
condition under the care of her daughter.
IMPRESSION: 1. Status post vertebral body augmentation for painful sacral
insufficiency fractures using sacroplasty technique.

## 2019-05-12 DIAGNOSIS — M810 Age-related osteoporosis without current pathological fracture: Secondary | ICD-10-CM | POA: Diagnosis not present

## 2019-05-24 DIAGNOSIS — D519 Vitamin B12 deficiency anemia, unspecified: Secondary | ICD-10-CM | POA: Diagnosis not present

## 2019-05-24 DIAGNOSIS — D509 Iron deficiency anemia, unspecified: Secondary | ICD-10-CM | POA: Diagnosis not present

## 2019-08-02 DIAGNOSIS — S79912A Unspecified injury of left hip, initial encounter: Secondary | ICD-10-CM | POA: Diagnosis not present

## 2019-08-02 DIAGNOSIS — S0003XA Contusion of scalp, initial encounter: Secondary | ICD-10-CM | POA: Diagnosis not present

## 2019-08-02 DIAGNOSIS — M533 Sacrococcygeal disorders, not elsewhere classified: Secondary | ICD-10-CM | POA: Diagnosis not present

## 2019-08-02 DIAGNOSIS — M160 Bilateral primary osteoarthritis of hip: Secondary | ICD-10-CM | POA: Diagnosis not present

## 2019-08-02 DIAGNOSIS — L03114 Cellulitis of left upper limb: Secondary | ICD-10-CM | POA: Diagnosis not present

## 2019-08-02 DIAGNOSIS — S060X0A Concussion without loss of consciousness, initial encounter: Secondary | ICD-10-CM | POA: Diagnosis not present

## 2019-08-02 DIAGNOSIS — R9431 Abnormal electrocardiogram [ECG] [EKG]: Secondary | ICD-10-CM | POA: Diagnosis not present

## 2019-08-02 DIAGNOSIS — M4312 Spondylolisthesis, cervical region: Secondary | ICD-10-CM | POA: Diagnosis not present

## 2019-08-02 DIAGNOSIS — M25551 Pain in right hip: Secondary | ICD-10-CM | POA: Diagnosis not present

## 2019-08-02 DIAGNOSIS — R112 Nausea with vomiting, unspecified: Secondary | ICD-10-CM | POA: Diagnosis not present

## 2019-08-02 DIAGNOSIS — S50812A Abrasion of left forearm, initial encounter: Secondary | ICD-10-CM | POA: Diagnosis not present

## 2019-08-02 DIAGNOSIS — Z880 Allergy status to penicillin: Secondary | ICD-10-CM | POA: Diagnosis not present

## 2019-08-02 DIAGNOSIS — I493 Ventricular premature depolarization: Secondary | ICD-10-CM | POA: Diagnosis not present

## 2019-08-02 DIAGNOSIS — S79911A Unspecified injury of right hip, initial encounter: Secondary | ICD-10-CM | POA: Diagnosis not present

## 2019-08-02 DIAGNOSIS — S199XXA Unspecified injury of neck, initial encounter: Secondary | ICD-10-CM | POA: Diagnosis not present

## 2019-08-02 DIAGNOSIS — M25552 Pain in left hip: Secondary | ICD-10-CM | POA: Diagnosis not present

## 2019-08-02 DIAGNOSIS — S0990XA Unspecified injury of head, initial encounter: Secondary | ICD-10-CM | POA: Diagnosis not present

## 2019-08-02 DIAGNOSIS — D696 Thrombocytopenia, unspecified: Secondary | ICD-10-CM | POA: Diagnosis not present

## 2019-08-02 DIAGNOSIS — W010XXA Fall on same level from slipping, tripping and stumbling without subsequent striking against object, initial encounter: Secondary | ICD-10-CM | POA: Diagnosis not present

## 2019-08-03 DIAGNOSIS — R9431 Abnormal electrocardiogram [ECG] [EKG]: Secondary | ICD-10-CM | POA: Diagnosis not present

## 2019-08-11 DIAGNOSIS — Z681 Body mass index (BMI) 19 or less, adult: Secondary | ICD-10-CM | POA: Diagnosis not present

## 2019-08-11 DIAGNOSIS — Z0000 Encounter for general adult medical examination without abnormal findings: Secondary | ICD-10-CM | POA: Diagnosis not present

## 2019-08-11 DIAGNOSIS — W19XXXA Unspecified fall, initial encounter: Secondary | ICD-10-CM | POA: Diagnosis not present

## 2019-08-11 DIAGNOSIS — Y92009 Unspecified place in unspecified non-institutional (private) residence as the place of occurrence of the external cause: Secondary | ICD-10-CM | POA: Diagnosis not present

## 2019-08-11 DIAGNOSIS — S300XXA Contusion of lower back and pelvis, initial encounter: Secondary | ICD-10-CM | POA: Diagnosis not present

## 2019-09-16 DIAGNOSIS — D519 Vitamin B12 deficiency anemia, unspecified: Secondary | ICD-10-CM | POA: Diagnosis not present

## 2019-09-16 DIAGNOSIS — D509 Iron deficiency anemia, unspecified: Secondary | ICD-10-CM | POA: Diagnosis not present

## 2019-09-23 DIAGNOSIS — D519 Vitamin B12 deficiency anemia, unspecified: Secondary | ICD-10-CM | POA: Diagnosis not present

## 2019-09-23 DIAGNOSIS — D509 Iron deficiency anemia, unspecified: Secondary | ICD-10-CM | POA: Diagnosis not present

## 2019-10-03 DIAGNOSIS — D519 Vitamin B12 deficiency anemia, unspecified: Secondary | ICD-10-CM | POA: Diagnosis not present

## 2019-10-03 DIAGNOSIS — D509 Iron deficiency anemia, unspecified: Secondary | ICD-10-CM | POA: Diagnosis not present

## 2019-10-10 DIAGNOSIS — D509 Iron deficiency anemia, unspecified: Secondary | ICD-10-CM | POA: Diagnosis not present

## 2019-12-12 DIAGNOSIS — Z23 Encounter for immunization: Secondary | ICD-10-CM | POA: Diagnosis not present

## 2019-12-22 ENCOUNTER — Other Ambulatory Visit: Payer: Self-pay | Admitting: Oncology

## 2019-12-22 DIAGNOSIS — D509 Iron deficiency anemia, unspecified: Secondary | ICD-10-CM

## 2019-12-22 NOTE — Progress Notes (Signed)
[facility]  [address] [place],  [place]  [address] [phone]  Clinic Day:  12/23/2019  Referring physician: Mateo Flow, MD   HISTORY OF PRESENT ILLNESS:  The patient is a 79 y.o. female with anemia secondary to both iron and vitamin B12 deficiency.  As her hemoglobin and iron levels were low at her last visit, IV Feraheme was given again to replenish her iron stores and improve her hemoglobin.  She comes in today to reassess these levels.  She complains of feeling intermittently short of breath, but denies having other particular changes in her health.  She denies having increased fatigue or any overt forms of blood loss which concern her persistent anemia.   PHYSICAL EXAM:  Blood pressure (!) 169/70, pulse 86, temperature (!) 97.5 F (36.4 C), resp. rate 16, height 5' 5.5" (1.664 m), weight 121 lb 8 oz (55.1 kg), SpO2 98 %. Wt Readings from Last 3 Encounters:  12/23/19 121 lb 8 oz (55.1 kg)  07/08/16 125 lb (56.7 kg)   Body mass index is 19.91 kg/m. Performance status (ECOG): 1 - Symptomatic but completely ambulatory Physical Exam Constitutional:      Appearance: Normal appearance. She is not ill-appearing.  HENT:     Mouth/Throat:     Mouth: Mucous membranes are moist.     Pharynx: Oropharynx is clear. No oropharyngeal exudate or posterior oropharyngeal erythema.  Cardiovascular:     Rate and Rhythm: Normal rate and regular rhythm.     Heart sounds: No murmur heard.  No friction rub. No gallop.   Pulmonary:     Effort: Pulmonary effort is normal. No respiratory distress.     Breath sounds: Normal breath sounds. No wheezing, rhonchi or rales.  Abdominal:     General: Bowel sounds are normal. There is no distension.     Palpations: Abdomen is soft. There is no mass.     Tenderness: There is no abdominal tenderness.  Musculoskeletal:        General: No swelling.     Right lower leg: No edema.     Left lower leg: No edema.   Lymphadenopathy:     Cervical: No cervical adenopathy.     Upper Body:     Right upper body: No supraclavicular or axillary adenopathy.     Left upper body: No supraclavicular or axillary adenopathy.     Lower Body: No right inguinal adenopathy. No left inguinal adenopathy.  Skin:    General: Skin is warm.     Coloration: Skin is not jaundiced.     Findings: No lesion or rash.  Neurological:     General: No focal deficit present.     Mental Status: She is alert and oriented to person, place, and time. Mental status is at baseline.     Cranial Nerves: Cranial nerves are intact.  Psychiatric:        Mood and Affect: Mood normal.        Behavior: Behavior normal.        Thought Content: Thought content normal.     LABS:    Ref. Range 12/23/2019 00:00  Sodium Latest Ref Range: 137 - 147  140  Potassium Latest Ref Range: 3.4 - 5.3  3.6  Chloride Latest Ref Range: 99 - 108  106  CO2 Latest Ref Range: 13 - 22  29 (A)  Glucose Unknown 64  BUN Latest Ref Range: 4 - 21  20  Creatinine Latest Ref Range:  0.5 - 1.1  0.7  Calcium Latest Ref Range: 8.7 - 10.7  8.9  Alkaline Phosphatase Latest Ref Range: 25 - 125  352 (A)  Albumin Latest Ref Range: 3.5 - 5.0  3.3 (A)  AST Latest Ref Range: 13 - 35  95 (A)  ALT Latest Ref Range: 7 - 35  52 (A)  WBC Unknown 4.2  RBC Latest Ref Range: 3.87 - 5.11  3.71 (A)  Hemoglobin Latest Ref Range: 12.0 - 16.0  11.8 (A)  HCT Latest Ref Range: 36 - 46  35 (A)  MCV Latest Ref Range: 76 - 111  94     Ref. Range 12/23/2019 11:03  Iron Latest Ref Range: 28 - 170 ug/dL 108  UIBC Latest Units: ug/dL 148  TIBC Latest Ref Range: 250 - 450 ug/dL 256  Saturation Ratios Latest Ref Range: 10.4 - 31.8 % 42 (H)  Ferritin Latest Ref Range: 11 - 307 ng/mL 68  Vitamin B12 Latest Ref Range: 180 - 914 pg/mL 827     ASSESSMENT & PLAN:   Assessment/Plan:  A 79 y.o. female with iron deficiency anemia, as well as a history of B12 deficiency.  Based upon her labs  today, her iron and B12 levels remain fine.  Her hemoglobin continues to hold stable as well.  Of note, when walking her throughout our hallways, her oximeter never showed signs of oxygen desaturation.  Clinically, the patient appears to be doing fine.  I will see her back in 4 months for repeat clinical assessment.  The patient understands all the plans discussed today and is in agreement with them.      Fleming Prill Macarthur Critchley, MD

## 2019-12-23 ENCOUNTER — Inpatient Hospital Stay: Payer: Medicare Other | Attending: Oncology

## 2019-12-23 ENCOUNTER — Telehealth: Payer: Self-pay | Admitting: Oncology

## 2019-12-23 ENCOUNTER — Other Ambulatory Visit: Payer: Self-pay | Admitting: Hematology and Oncology

## 2019-12-23 ENCOUNTER — Inpatient Hospital Stay (INDEPENDENT_AMBULATORY_CARE_PROVIDER_SITE_OTHER): Payer: Medicare Other | Admitting: Oncology

## 2019-12-23 ENCOUNTER — Other Ambulatory Visit: Payer: Self-pay | Admitting: Oncology

## 2019-12-23 ENCOUNTER — Encounter: Payer: Self-pay | Admitting: Oncology

## 2019-12-23 ENCOUNTER — Other Ambulatory Visit: Payer: Self-pay

## 2019-12-23 VITALS — BP 169/70 | HR 86 | Temp 97.5°F | Resp 16 | Ht 65.5 in | Wt 121.5 lb

## 2019-12-23 DIAGNOSIS — D509 Iron deficiency anemia, unspecified: Secondary | ICD-10-CM | POA: Insufficient documentation

## 2019-12-23 DIAGNOSIS — D649 Anemia, unspecified: Secondary | ICD-10-CM | POA: Diagnosis not present

## 2019-12-23 DIAGNOSIS — Z23 Encounter for immunization: Secondary | ICD-10-CM | POA: Diagnosis not present

## 2019-12-23 DIAGNOSIS — E538 Deficiency of other specified B group vitamins: Secondary | ICD-10-CM

## 2019-12-23 DIAGNOSIS — Z0001 Encounter for general adult medical examination with abnormal findings: Secondary | ICD-10-CM | POA: Diagnosis not present

## 2019-12-23 LAB — BASIC METABOLIC PANEL WITH GFR
BUN: 20 (ref 4–21)
CO2: 29 — AB (ref 13–22)
Chloride: 106 (ref 99–108)
Creatinine: 0.7 (ref 0.5–1.1)
Glucose: 64
Potassium: 3.6 (ref 3.4–5.3)
Sodium: 140 (ref 137–147)

## 2019-12-23 LAB — HEPATIC FUNCTION PANEL
ALT: 52 — AB (ref 7–35)
AST: 95 — AB (ref 13–35)
Alkaline Phosphatase: 352 — AB (ref 25–125)

## 2019-12-23 LAB — CBC AND DIFFERENTIAL
HCT: 35 — AB (ref 36–46)
Hemoglobin: 11.8 — AB (ref 12.0–16.0)
Neutrophils Absolute: 2.69
Platelets: 93 — AB (ref 150–399)
WBC: 4.2

## 2019-12-23 LAB — CBC
MCV: 94 (ref 76–111)
RBC: 3.71 — AB (ref 3.87–5.11)

## 2019-12-23 LAB — FERRITIN: Ferritin: 68 ng/mL (ref 11–307)

## 2019-12-23 LAB — IRON AND TIBC
Iron: 108 ug/dL (ref 28–170)
Saturation Ratios: 42 % — ABNORMAL HIGH (ref 10.4–31.8)
TIBC: 256 ug/dL (ref 250–450)
UIBC: 148 ug/dL

## 2019-12-23 LAB — COMPREHENSIVE METABOLIC PANEL WITH GFR
Albumin: 3.3 — AB (ref 3.5–5.0)
Calcium: 8.9 (ref 8.7–10.7)

## 2019-12-23 LAB — VITAMIN B12: Vitamin B-12: 827 pg/mL (ref 180–914)

## 2019-12-23 NOTE — Telephone Encounter (Signed)
Per 12/3 los -40yr f/u appt given to patient.

## 2020-01-27 DIAGNOSIS — M7021 Olecranon bursitis, right elbow: Secondary | ICD-10-CM | POA: Diagnosis not present

## 2020-01-31 DIAGNOSIS — H16143 Punctate keratitis, bilateral: Secondary | ICD-10-CM | POA: Diagnosis not present

## 2020-01-31 DIAGNOSIS — H18513 Endothelial corneal dystrophy, bilateral: Secondary | ICD-10-CM | POA: Diagnosis not present

## 2020-03-07 DIAGNOSIS — D509 Iron deficiency anemia, unspecified: Secondary | ICD-10-CM | POA: Diagnosis not present

## 2020-03-07 DIAGNOSIS — R7401 Elevation of levels of liver transaminase levels: Secondary | ICD-10-CM | POA: Diagnosis not present

## 2020-03-07 DIAGNOSIS — I10 Essential (primary) hypertension: Secondary | ICD-10-CM | POA: Diagnosis not present

## 2020-03-07 DIAGNOSIS — W5503XA Scratched by cat, initial encounter: Secondary | ICD-10-CM | POA: Diagnosis not present

## 2020-03-15 DIAGNOSIS — N281 Cyst of kidney, acquired: Secondary | ICD-10-CM | POA: Diagnosis not present

## 2020-03-15 DIAGNOSIS — K7460 Unspecified cirrhosis of liver: Secondary | ICD-10-CM | POA: Diagnosis not present

## 2020-03-15 DIAGNOSIS — R7401 Elevation of levels of liver transaminase levels: Secondary | ICD-10-CM | POA: Diagnosis not present

## 2020-04-02 DIAGNOSIS — H353132 Nonexudative age-related macular degeneration, bilateral, intermediate dry stage: Secondary | ICD-10-CM | POA: Diagnosis not present

## 2020-04-02 DIAGNOSIS — I10 Essential (primary) hypertension: Secondary | ICD-10-CM | POA: Diagnosis not present

## 2020-04-02 DIAGNOSIS — H524 Presbyopia: Secondary | ICD-10-CM | POA: Diagnosis not present

## 2020-04-02 DIAGNOSIS — Z9842 Cataract extraction status, left eye: Secondary | ICD-10-CM | POA: Diagnosis not present

## 2020-04-02 DIAGNOSIS — H52223 Regular astigmatism, bilateral: Secondary | ICD-10-CM | POA: Diagnosis not present

## 2020-04-02 DIAGNOSIS — Z961 Presence of intraocular lens: Secondary | ICD-10-CM | POA: Diagnosis not present

## 2020-04-02 DIAGNOSIS — Z9841 Cataract extraction status, right eye: Secondary | ICD-10-CM | POA: Diagnosis not present

## 2020-04-04 DIAGNOSIS — K2970 Gastritis, unspecified, without bleeding: Secondary | ICD-10-CM | POA: Diagnosis not present

## 2020-04-04 DIAGNOSIS — K5520 Angiodysplasia of colon without hemorrhage: Secondary | ICD-10-CM | POA: Diagnosis not present

## 2020-04-05 DIAGNOSIS — H353132 Nonexudative age-related macular degeneration, bilateral, intermediate dry stage: Secondary | ICD-10-CM | POA: Diagnosis not present

## 2020-04-05 DIAGNOSIS — H04123 Dry eye syndrome of bilateral lacrimal glands: Secondary | ICD-10-CM | POA: Diagnosis not present

## 2020-04-05 DIAGNOSIS — R945 Abnormal results of liver function studies: Secondary | ICD-10-CM | POA: Diagnosis not present

## 2020-04-05 DIAGNOSIS — R935 Abnormal findings on diagnostic imaging of other abdominal regions, including retroperitoneum: Secondary | ICD-10-CM | POA: Diagnosis not present

## 2020-04-12 DIAGNOSIS — D509 Iron deficiency anemia, unspecified: Secondary | ICD-10-CM | POA: Insufficient documentation

## 2020-04-22 NOTE — Progress Notes (Signed)
[facility]  [address] [place],  [place]  [address] [phone]  Clinic Day:  04/23/2020  Referring physician: Mateo Flow, MD   HISTORY OF PRESENT ILLNESS:  The patient is a 80 y.o. female with anemia secondary to both iron and vitamin B12 deficiency.  In the past,  IV Feraheme was very effective in replenishing her iron stores and improving her hemoglobin.  She comes in today to reassess these levels.  Since her last visit, the patient has been doing well.  She claims to have had 1 episode of what she believes is vaginal bleeding.  She denies having other overt forms of blood loss.    PHYSICAL EXAM:  Blood pressure (!) 174/87, pulse 88, temperature (!) 97.5 F (36.4 C), resp. rate 16, height 5' 5.5" (1.664 m), weight 124 lb 11.2 oz (56.6 kg), SpO2 92 %. Wt Readings from Last 3 Encounters:  04/23/20 124 lb 11.2 oz (56.6 kg)  12/23/19 121 lb 8 oz (55.1 kg)  07/08/16 125 lb (56.7 kg)   Body mass index is 20.44 kg/m. Performance status (ECOG): 1 - Symptomatic but completely ambulatory Physical Exam Constitutional:      Appearance: Normal appearance. She is not ill-appearing.  HENT:     Mouth/Throat:     Mouth: Mucous membranes are moist.     Pharynx: Oropharynx is clear. No oropharyngeal exudate or posterior oropharyngeal erythema.  Cardiovascular:     Rate and Rhythm: Normal rate and regular rhythm.     Heart sounds: No murmur heard. No friction rub. No gallop.   Pulmonary:     Effort: Pulmonary effort is normal. No respiratory distress.     Breath sounds: Normal breath sounds. No wheezing, rhonchi or rales.  Chest:  Breasts:     Right: No axillary adenopathy or supraclavicular adenopathy.     Left: No axillary adenopathy or supraclavicular adenopathy.    Abdominal:     General: Bowel sounds are normal. There is no distension.     Palpations: Abdomen is soft. There is no mass.     Tenderness: There is no abdominal tenderness.   Musculoskeletal:        General: No swelling.     Right lower leg: No edema.     Left lower leg: No edema.  Lymphadenopathy:     Cervical: No cervical adenopathy.     Upper Body:     Right upper body: No supraclavicular or axillary adenopathy.     Left upper body: No supraclavicular or axillary adenopathy.     Lower Body: No right inguinal adenopathy. No left inguinal adenopathy.  Skin:    General: Skin is warm.     Coloration: Skin is not jaundiced.     Findings: No lesion or rash.  Neurological:     General: No focal deficit present.     Mental Status: She is alert and oriented to person, place, and time. Mental status is at baseline.     Cranial Nerves: Cranial nerves are intact.  Psychiatric:        Mood and Affect: Mood normal.        Behavior: Behavior normal.        Thought Content: Thought content normal.     LABS:    Ref. Range 04/23/2020 00:00  WBC Unknown 3.7  RBC Latest Ref Range: 3.87 - 5.11  3.65 (A)  Hemoglobin Latest Ref Range: 12.0 - 16.0  11.7 (A)  HCT Latest Ref Range: 36 -  46  35 (A)  MCV Latest Ref Range: 81 - 99  97  Platelets Latest Ref Range: 150 - 399  78 (A)    Ref. Range 04/23/2020 09:23  Iron Latest Ref Range: 28 - 170 ug/dL 111  UIBC Latest Units: ug/dL 204  TIBC Latest Ref Range: 250 - 450 ug/dL 315  Saturation Ratios Latest Ref Range: 10.4 - 31.8 % 35 (H)  Ferritin Latest Ref Range: 11 - 307 ng/mL 50  Vitamin B12 Latest Ref Range: 180 - 914 pg/mL 967 (H)    ASSESSMENT & PLAN:  Assessment/Plan:  A 80 y.o. female with iron deficiency anemia, as well as a history of B12 deficiency.  Based upon her labs today, her iron and B12 levels remain fine.  Her hemoglobin continues to hold stable as well.  However, there has been a gradual decline in her white cells and platelets.  I will see her back in 2 months to reassess her peripheral counts.  If they continue to fall, the patient understands a bone marrow would be considered next to ensure no  intrinsic marrow disorder is not behind her low counts.  The patient understands all the plans discussed today and is in agreement with them.    Niaomi Cartaya Macarthur Critchley, MD

## 2020-04-23 ENCOUNTER — Other Ambulatory Visit: Payer: Self-pay | Admitting: Oncology

## 2020-04-23 ENCOUNTER — Inpatient Hospital Stay: Payer: Medicare Other

## 2020-04-23 ENCOUNTER — Other Ambulatory Visit: Payer: Self-pay

## 2020-04-23 ENCOUNTER — Inpatient Hospital Stay: Payer: Medicare Other | Attending: Oncology | Admitting: Oncology

## 2020-04-23 ENCOUNTER — Other Ambulatory Visit: Payer: Self-pay | Admitting: Hematology and Oncology

## 2020-04-23 ENCOUNTER — Telehealth: Payer: Self-pay | Admitting: Oncology

## 2020-04-23 DIAGNOSIS — D518 Other vitamin B12 deficiency anemias: Secondary | ICD-10-CM

## 2020-04-23 DIAGNOSIS — D509 Iron deficiency anemia, unspecified: Secondary | ICD-10-CM

## 2020-04-23 DIAGNOSIS — D508 Other iron deficiency anemias: Secondary | ICD-10-CM

## 2020-04-23 DIAGNOSIS — D649 Anemia, unspecified: Secondary | ICD-10-CM | POA: Diagnosis not present

## 2020-04-23 DIAGNOSIS — E538 Deficiency of other specified B group vitamins: Secondary | ICD-10-CM

## 2020-04-23 LAB — CBC AND DIFFERENTIAL
HCT: 35 — AB (ref 36–46)
Hemoglobin: 11.7 — AB (ref 12.0–16.0)
Neutrophils Absolute: 2.78
Platelets: 78 — AB (ref 150–399)
WBC: 3.7

## 2020-04-23 LAB — HEPATIC FUNCTION PANEL
ALT: 37 — AB (ref 7–35)
AST: 56 — AB (ref 13–35)
Alkaline Phosphatase: 207 — AB (ref 25–125)
Bilirubin, Total: 1.9

## 2020-04-23 LAB — COMPREHENSIVE METABOLIC PANEL
Albumin: 3.3 — AB (ref 3.5–5.0)
Calcium: 9 (ref 8.7–10.7)

## 2020-04-23 LAB — BASIC METABOLIC PANEL
BUN: 30 — AB (ref 4–21)
CO2: 28 — AB (ref 13–22)
Chloride: 111 — AB (ref 99–108)
Creatinine: 0.7 (ref 0.5–1.1)
Glucose: 137
Potassium: 3.9 (ref 3.4–5.3)
Sodium: 143 (ref 137–147)

## 2020-04-23 LAB — IRON AND TIBC
Iron: 111 ug/dL (ref 28–170)
Saturation Ratios: 35 % — ABNORMAL HIGH (ref 10.4–31.8)
TIBC: 315 ug/dL (ref 250–450)
UIBC: 204 ug/dL

## 2020-04-23 LAB — VITAMIN B12: Vitamin B-12: 967 pg/mL — ABNORMAL HIGH (ref 180–914)

## 2020-04-23 LAB — CBC
Absolute Lymphocytes: 0.52 — AB (ref 0.65–4.75)
MCV: 97 (ref 81–99)
RBC: 3.65 — AB (ref 3.87–5.11)

## 2020-04-23 LAB — FERRITIN: Ferritin: 50 ng/mL (ref 11–307)

## 2020-04-23 LAB — CORRECTED CALCIUM (CC13): Calcium, Corrected: 9.7

## 2020-04-23 NOTE — Telephone Encounter (Signed)
Per 4/4 los spoke with patient and scheduled next appt

## 2020-04-23 NOTE — Progress Notes (Signed)
Pt c/o dizziness

## 2020-05-09 DIAGNOSIS — R600 Localized edema: Secondary | ICD-10-CM | POA: Diagnosis not present

## 2020-05-09 DIAGNOSIS — J90 Pleural effusion, not elsewhere classified: Secondary | ICD-10-CM | POA: Diagnosis not present

## 2020-05-09 DIAGNOSIS — M79604 Pain in right leg: Secondary | ICD-10-CM | POA: Diagnosis not present

## 2020-05-09 DIAGNOSIS — R609 Edema, unspecified: Secondary | ICD-10-CM | POA: Diagnosis not present

## 2020-05-09 DIAGNOSIS — R0602 Shortness of breath: Secondary | ICD-10-CM | POA: Diagnosis not present

## 2020-05-09 DIAGNOSIS — K743 Primary biliary cirrhosis: Secondary | ICD-10-CM | POA: Diagnosis not present

## 2020-05-09 DIAGNOSIS — M7989 Other specified soft tissue disorders: Secondary | ICD-10-CM | POA: Diagnosis not present

## 2020-05-09 DIAGNOSIS — J439 Emphysema, unspecified: Secondary | ICD-10-CM | POA: Diagnosis not present

## 2020-05-09 DIAGNOSIS — D61818 Other pancytopenia: Secondary | ICD-10-CM | POA: Diagnosis not present

## 2020-05-09 DIAGNOSIS — R911 Solitary pulmonary nodule: Secondary | ICD-10-CM | POA: Diagnosis not present

## 2020-05-09 DIAGNOSIS — L03116 Cellulitis of left lower limb: Secondary | ICD-10-CM | POA: Diagnosis not present

## 2020-05-09 DIAGNOSIS — M79605 Pain in left leg: Secondary | ICD-10-CM | POA: Diagnosis not present

## 2020-05-09 DIAGNOSIS — I517 Cardiomegaly: Secondary | ICD-10-CM | POA: Diagnosis not present

## 2020-05-10 DIAGNOSIS — Z87891 Personal history of nicotine dependence: Secondary | ICD-10-CM | POA: Diagnosis not present

## 2020-05-10 DIAGNOSIS — M1990 Unspecified osteoarthritis, unspecified site: Secondary | ICD-10-CM | POA: Diagnosis present

## 2020-05-10 DIAGNOSIS — D508 Other iron deficiency anemias: Secondary | ICD-10-CM | POA: Diagnosis present

## 2020-05-10 DIAGNOSIS — E119 Type 2 diabetes mellitus without complications: Secondary | ICD-10-CM | POA: Diagnosis present

## 2020-05-10 DIAGNOSIS — Z880 Allergy status to penicillin: Secondary | ICD-10-CM | POA: Diagnosis not present

## 2020-05-10 DIAGNOSIS — M7989 Other specified soft tissue disorders: Secondary | ICD-10-CM | POA: Diagnosis not present

## 2020-05-10 DIAGNOSIS — R600 Localized edema: Secondary | ICD-10-CM | POA: Diagnosis not present

## 2020-05-10 DIAGNOSIS — E538 Deficiency of other specified B group vitamins: Secondary | ICD-10-CM | POA: Diagnosis present

## 2020-05-10 DIAGNOSIS — S90822A Blister (nonthermal), left foot, initial encounter: Secondary | ICD-10-CM | POA: Diagnosis present

## 2020-05-10 DIAGNOSIS — K743 Primary biliary cirrhosis: Secondary | ICD-10-CM | POA: Diagnosis present

## 2020-05-10 DIAGNOSIS — R0602 Shortness of breath: Secondary | ICD-10-CM | POA: Diagnosis not present

## 2020-05-10 DIAGNOSIS — J439 Emphysema, unspecified: Secondary | ICD-10-CM | POA: Diagnosis not present

## 2020-05-10 DIAGNOSIS — Z79899 Other long term (current) drug therapy: Secondary | ICD-10-CM | POA: Diagnosis not present

## 2020-05-10 DIAGNOSIS — I517 Cardiomegaly: Secondary | ICD-10-CM | POA: Diagnosis not present

## 2020-05-10 DIAGNOSIS — R609 Edema, unspecified: Secondary | ICD-10-CM | POA: Diagnosis not present

## 2020-05-10 DIAGNOSIS — E785 Hyperlipidemia, unspecified: Secondary | ICD-10-CM | POA: Diagnosis present

## 2020-05-10 DIAGNOSIS — I10 Essential (primary) hypertension: Secondary | ICD-10-CM | POA: Diagnosis present

## 2020-05-10 DIAGNOSIS — R911 Solitary pulmonary nodule: Secondary | ICD-10-CM | POA: Diagnosis not present

## 2020-05-10 DIAGNOSIS — L03116 Cellulitis of left lower limb: Secondary | ICD-10-CM | POA: Diagnosis not present

## 2020-05-10 DIAGNOSIS — E039 Hypothyroidism, unspecified: Secondary | ICD-10-CM | POA: Diagnosis present

## 2020-05-10 DIAGNOSIS — I340 Nonrheumatic mitral (valve) insufficiency: Secondary | ICD-10-CM | POA: Diagnosis not present

## 2020-05-10 DIAGNOSIS — I361 Nonrheumatic tricuspid (valve) insufficiency: Secondary | ICD-10-CM | POA: Diagnosis not present

## 2020-05-10 DIAGNOSIS — D61818 Other pancytopenia: Secondary | ICD-10-CM | POA: Diagnosis not present

## 2020-05-10 DIAGNOSIS — J90 Pleural effusion, not elsewhere classified: Secondary | ICD-10-CM | POA: Diagnosis not present

## 2020-05-10 DIAGNOSIS — M79604 Pain in right leg: Secondary | ICD-10-CM | POA: Diagnosis not present

## 2020-05-10 DIAGNOSIS — D890 Polyclonal hypergammaglobulinemia: Secondary | ICD-10-CM | POA: Diagnosis present

## 2020-05-10 DIAGNOSIS — K219 Gastro-esophageal reflux disease without esophagitis: Secondary | ICD-10-CM | POA: Diagnosis present

## 2020-05-10 DIAGNOSIS — M79605 Pain in left leg: Secondary | ICD-10-CM | POA: Diagnosis not present

## 2020-05-17 DIAGNOSIS — K2970 Gastritis, unspecified, without bleeding: Secondary | ICD-10-CM | POA: Diagnosis not present

## 2020-05-17 DIAGNOSIS — K5520 Angiodysplasia of colon without hemorrhage: Secondary | ICD-10-CM | POA: Diagnosis not present

## 2020-05-18 DIAGNOSIS — R2242 Localized swelling, mass and lump, left lower limb: Secondary | ICD-10-CM | POA: Diagnosis not present

## 2020-05-18 DIAGNOSIS — I10 Essential (primary) hypertension: Secondary | ICD-10-CM | POA: Diagnosis not present

## 2020-05-18 DIAGNOSIS — K745 Biliary cirrhosis, unspecified: Secondary | ICD-10-CM | POA: Diagnosis not present

## 2020-05-18 DIAGNOSIS — Z681 Body mass index (BMI) 19 or less, adult: Secondary | ICD-10-CM | POA: Diagnosis not present

## 2020-05-25 DIAGNOSIS — R2242 Localized swelling, mass and lump, left lower limb: Secondary | ICD-10-CM | POA: Diagnosis not present

## 2020-05-25 DIAGNOSIS — K7460 Unspecified cirrhosis of liver: Secondary | ICD-10-CM | POA: Diagnosis not present

## 2020-05-25 DIAGNOSIS — S90822A Blister (nonthermal), left foot, initial encounter: Secondary | ICD-10-CM | POA: Diagnosis not present

## 2020-05-25 DIAGNOSIS — Z681 Body mass index (BMI) 19 or less, adult: Secondary | ICD-10-CM | POA: Diagnosis not present

## 2020-05-31 DIAGNOSIS — Z681 Body mass index (BMI) 19 or less, adult: Secondary | ICD-10-CM | POA: Diagnosis not present

## 2020-05-31 DIAGNOSIS — S90822A Blister (nonthermal), left foot, initial encounter: Secondary | ICD-10-CM | POA: Diagnosis not present

## 2020-05-31 DIAGNOSIS — E46 Unspecified protein-calorie malnutrition: Secondary | ICD-10-CM | POA: Diagnosis not present

## 2020-05-31 DIAGNOSIS — Z1331 Encounter for screening for depression: Secondary | ICD-10-CM | POA: Diagnosis not present

## 2020-06-14 DIAGNOSIS — Z681 Body mass index (BMI) 19 or less, adult: Secondary | ICD-10-CM | POA: Diagnosis not present

## 2020-06-14 DIAGNOSIS — K743 Primary biliary cirrhosis: Secondary | ICD-10-CM | POA: Diagnosis not present

## 2020-06-16 DIAGNOSIS — R410 Disorientation, unspecified: Secondary | ICD-10-CM | POA: Diagnosis not present

## 2020-06-16 DIAGNOSIS — R739 Hyperglycemia, unspecified: Secondary | ICD-10-CM | POA: Diagnosis present

## 2020-06-16 DIAGNOSIS — R29701 NIHSS score 1: Secondary | ICD-10-CM | POA: Diagnosis not present

## 2020-06-16 DIAGNOSIS — M1990 Unspecified osteoarthritis, unspecified site: Secondary | ICD-10-CM | POA: Diagnosis present

## 2020-06-16 DIAGNOSIS — Z742 Need for assistance at home and no other household member able to render care: Secondary | ICD-10-CM | POA: Diagnosis present

## 2020-06-16 DIAGNOSIS — Z862 Personal history of diseases of the blood and blood-forming organs and certain disorders involving the immune mechanism: Secondary | ICD-10-CM | POA: Diagnosis not present

## 2020-06-16 DIAGNOSIS — E785 Hyperlipidemia, unspecified: Secondary | ICD-10-CM | POA: Diagnosis present

## 2020-06-16 DIAGNOSIS — R000 Tachycardia, unspecified: Secondary | ICD-10-CM | POA: Diagnosis not present

## 2020-06-16 DIAGNOSIS — G9341 Metabolic encephalopathy: Secondary | ICD-10-CM | POA: Diagnosis not present

## 2020-06-16 DIAGNOSIS — I959 Hypotension, unspecified: Secondary | ICD-10-CM | POA: Diagnosis not present

## 2020-06-16 DIAGNOSIS — R4182 Altered mental status, unspecified: Secondary | ICD-10-CM | POA: Diagnosis not present

## 2020-06-16 DIAGNOSIS — K7460 Unspecified cirrhosis of liver: Secondary | ICD-10-CM | POA: Diagnosis not present

## 2020-06-16 DIAGNOSIS — K7290 Hepatic failure, unspecified without coma: Secondary | ICD-10-CM | POA: Diagnosis present

## 2020-06-16 DIAGNOSIS — D801 Nonfamilial hypogammaglobulinemia: Secondary | ICD-10-CM | POA: Diagnosis not present

## 2020-06-16 DIAGNOSIS — F0390 Unspecified dementia without behavioral disturbance: Secondary | ICD-10-CM | POA: Diagnosis present

## 2020-06-16 DIAGNOSIS — J439 Emphysema, unspecified: Secondary | ICD-10-CM | POA: Diagnosis present

## 2020-06-16 DIAGNOSIS — Z79899 Other long term (current) drug therapy: Secondary | ICD-10-CM | POA: Diagnosis not present

## 2020-06-16 DIAGNOSIS — R531 Weakness: Secondary | ICD-10-CM | POA: Diagnosis not present

## 2020-06-16 DIAGNOSIS — I10 Essential (primary) hypertension: Secondary | ICD-10-CM | POA: Diagnosis present

## 2020-06-16 DIAGNOSIS — E7220 Disorder of urea cycle metabolism, unspecified: Secondary | ICD-10-CM | POA: Diagnosis not present

## 2020-06-20 DIAGNOSIS — Z681 Body mass index (BMI) 19 or less, adult: Secondary | ICD-10-CM | POA: Diagnosis not present

## 2020-06-20 DIAGNOSIS — K743 Primary biliary cirrhosis: Secondary | ICD-10-CM | POA: Diagnosis not present

## 2020-06-20 DIAGNOSIS — E7220 Disorder of urea cycle metabolism, unspecified: Secondary | ICD-10-CM | POA: Diagnosis not present

## 2020-06-20 DIAGNOSIS — I10 Essential (primary) hypertension: Secondary | ICD-10-CM | POA: Diagnosis not present

## 2020-06-22 ENCOUNTER — Inpatient Hospital Stay: Payer: Medicare Other | Attending: Oncology | Admitting: Oncology

## 2020-06-22 ENCOUNTER — Inpatient Hospital Stay: Payer: Medicare Other

## 2020-06-22 DIAGNOSIS — E785 Hyperlipidemia, unspecified: Secondary | ICD-10-CM | POA: Diagnosis not present

## 2020-06-22 DIAGNOSIS — F0390 Unspecified dementia without behavioral disturbance: Secondary | ICD-10-CM | POA: Diagnosis not present

## 2020-06-22 DIAGNOSIS — Z862 Personal history of diseases of the blood and blood-forming organs and certain disorders involving the immune mechanism: Secondary | ICD-10-CM | POA: Diagnosis not present

## 2020-06-22 DIAGNOSIS — Z79899 Other long term (current) drug therapy: Secondary | ICD-10-CM | POA: Diagnosis not present

## 2020-06-22 DIAGNOSIS — G9341 Metabolic encephalopathy: Secondary | ICD-10-CM | POA: Diagnosis not present

## 2020-06-22 DIAGNOSIS — K7290 Hepatic failure, unspecified without coma: Secondary | ICD-10-CM | POA: Diagnosis not present

## 2020-06-22 DIAGNOSIS — R739 Hyperglycemia, unspecified: Secondary | ICD-10-CM | POA: Diagnosis not present

## 2020-06-22 DIAGNOSIS — K743 Primary biliary cirrhosis: Secondary | ICD-10-CM | POA: Diagnosis not present

## 2020-06-22 DIAGNOSIS — I10 Essential (primary) hypertension: Secondary | ICD-10-CM | POA: Diagnosis not present

## 2020-06-22 DIAGNOSIS — D801 Nonfamilial hypogammaglobulinemia: Secondary | ICD-10-CM | POA: Diagnosis not present

## 2020-06-22 DIAGNOSIS — J439 Emphysema, unspecified: Secondary | ICD-10-CM | POA: Diagnosis not present

## 2020-06-22 DIAGNOSIS — M1990 Unspecified osteoarthritis, unspecified site: Secondary | ICD-10-CM | POA: Diagnosis not present

## 2020-06-22 DIAGNOSIS — Z742 Need for assistance at home and no other household member able to render care: Secondary | ICD-10-CM | POA: Diagnosis not present

## 2020-06-26 DIAGNOSIS — G9341 Metabolic encephalopathy: Secondary | ICD-10-CM | POA: Diagnosis not present

## 2020-06-26 DIAGNOSIS — K743 Primary biliary cirrhosis: Secondary | ICD-10-CM | POA: Diagnosis not present

## 2020-06-26 DIAGNOSIS — F0390 Unspecified dementia without behavioral disturbance: Secondary | ICD-10-CM | POA: Diagnosis not present

## 2020-06-26 DIAGNOSIS — J439 Emphysema, unspecified: Secondary | ICD-10-CM | POA: Diagnosis not present

## 2020-06-26 DIAGNOSIS — K7290 Hepatic failure, unspecified without coma: Secondary | ICD-10-CM | POA: Diagnosis not present

## 2020-06-26 DIAGNOSIS — I10 Essential (primary) hypertension: Secondary | ICD-10-CM | POA: Diagnosis not present

## 2020-06-27 DIAGNOSIS — K7290 Hepatic failure, unspecified without coma: Secondary | ICD-10-CM | POA: Diagnosis not present

## 2020-06-27 DIAGNOSIS — G9341 Metabolic encephalopathy: Secondary | ICD-10-CM | POA: Diagnosis not present

## 2020-06-27 DIAGNOSIS — J439 Emphysema, unspecified: Secondary | ICD-10-CM | POA: Diagnosis not present

## 2020-06-27 DIAGNOSIS — I10 Essential (primary) hypertension: Secondary | ICD-10-CM | POA: Diagnosis not present

## 2020-06-27 DIAGNOSIS — K743 Primary biliary cirrhosis: Secondary | ICD-10-CM | POA: Diagnosis not present

## 2020-06-27 DIAGNOSIS — F0390 Unspecified dementia without behavioral disturbance: Secondary | ICD-10-CM | POA: Diagnosis not present

## 2020-07-02 DIAGNOSIS — K7290 Hepatic failure, unspecified without coma: Secondary | ICD-10-CM | POA: Diagnosis not present

## 2020-07-02 DIAGNOSIS — G9341 Metabolic encephalopathy: Secondary | ICD-10-CM | POA: Diagnosis not present

## 2020-07-02 DIAGNOSIS — F0390 Unspecified dementia without behavioral disturbance: Secondary | ICD-10-CM | POA: Diagnosis not present

## 2020-07-02 DIAGNOSIS — K743 Primary biliary cirrhosis: Secondary | ICD-10-CM | POA: Diagnosis not present

## 2020-07-02 DIAGNOSIS — J439 Emphysema, unspecified: Secondary | ICD-10-CM | POA: Diagnosis not present

## 2020-07-02 DIAGNOSIS — I10 Essential (primary) hypertension: Secondary | ICD-10-CM | POA: Diagnosis not present

## 2020-07-03 DIAGNOSIS — K743 Primary biliary cirrhosis: Secondary | ICD-10-CM | POA: Diagnosis not present

## 2020-07-03 DIAGNOSIS — G9341 Metabolic encephalopathy: Secondary | ICD-10-CM | POA: Diagnosis not present

## 2020-07-03 DIAGNOSIS — K7290 Hepatic failure, unspecified without coma: Secondary | ICD-10-CM | POA: Diagnosis not present

## 2020-07-03 DIAGNOSIS — F0390 Unspecified dementia without behavioral disturbance: Secondary | ICD-10-CM | POA: Diagnosis not present

## 2020-07-03 DIAGNOSIS — I10 Essential (primary) hypertension: Secondary | ICD-10-CM | POA: Diagnosis not present

## 2020-07-03 DIAGNOSIS — J439 Emphysema, unspecified: Secondary | ICD-10-CM | POA: Diagnosis not present

## 2020-07-06 DIAGNOSIS — J439 Emphysema, unspecified: Secondary | ICD-10-CM | POA: Diagnosis not present

## 2020-07-06 DIAGNOSIS — F0390 Unspecified dementia without behavioral disturbance: Secondary | ICD-10-CM | POA: Diagnosis not present

## 2020-07-06 DIAGNOSIS — G9341 Metabolic encephalopathy: Secondary | ICD-10-CM | POA: Diagnosis not present

## 2020-07-06 DIAGNOSIS — K743 Primary biliary cirrhosis: Secondary | ICD-10-CM | POA: Diagnosis not present

## 2020-07-06 DIAGNOSIS — K7290 Hepatic failure, unspecified without coma: Secondary | ICD-10-CM | POA: Diagnosis not present

## 2020-07-06 DIAGNOSIS — I10 Essential (primary) hypertension: Secondary | ICD-10-CM | POA: Diagnosis not present

## 2020-07-10 DIAGNOSIS — J439 Emphysema, unspecified: Secondary | ICD-10-CM | POA: Diagnosis not present

## 2020-07-10 DIAGNOSIS — I10 Essential (primary) hypertension: Secondary | ICD-10-CM | POA: Diagnosis not present

## 2020-07-10 DIAGNOSIS — F0390 Unspecified dementia without behavioral disturbance: Secondary | ICD-10-CM | POA: Diagnosis not present

## 2020-07-10 DIAGNOSIS — K7290 Hepatic failure, unspecified without coma: Secondary | ICD-10-CM | POA: Diagnosis not present

## 2020-07-10 DIAGNOSIS — K743 Primary biliary cirrhosis: Secondary | ICD-10-CM | POA: Diagnosis not present

## 2020-07-10 DIAGNOSIS — G9341 Metabolic encephalopathy: Secondary | ICD-10-CM | POA: Diagnosis not present

## 2020-07-16 DIAGNOSIS — K5520 Angiodysplasia of colon without hemorrhage: Secondary | ICD-10-CM | POA: Diagnosis not present

## 2020-07-16 DIAGNOSIS — K2970 Gastritis, unspecified, without bleeding: Secondary | ICD-10-CM | POA: Diagnosis not present

## 2020-07-17 DIAGNOSIS — M545 Low back pain, unspecified: Secondary | ICD-10-CM | POA: Diagnosis not present

## 2020-07-17 DIAGNOSIS — Z681 Body mass index (BMI) 19 or less, adult: Secondary | ICD-10-CM | POA: Diagnosis not present

## 2020-07-17 DIAGNOSIS — M810 Age-related osteoporosis without current pathological fracture: Secondary | ICD-10-CM | POA: Diagnosis not present

## 2020-07-19 DIAGNOSIS — J439 Emphysema, unspecified: Secondary | ICD-10-CM | POA: Diagnosis not present

## 2020-07-19 DIAGNOSIS — K743 Primary biliary cirrhosis: Secondary | ICD-10-CM | POA: Diagnosis not present

## 2020-07-19 DIAGNOSIS — K7290 Hepatic failure, unspecified without coma: Secondary | ICD-10-CM | POA: Diagnosis not present

## 2020-07-19 DIAGNOSIS — I10 Essential (primary) hypertension: Secondary | ICD-10-CM | POA: Diagnosis not present

## 2020-07-19 DIAGNOSIS — G9341 Metabolic encephalopathy: Secondary | ICD-10-CM | POA: Diagnosis not present

## 2020-07-19 DIAGNOSIS — F0390 Unspecified dementia without behavioral disturbance: Secondary | ICD-10-CM | POA: Diagnosis not present

## 2020-07-22 DIAGNOSIS — K7290 Hepatic failure, unspecified without coma: Secondary | ICD-10-CM | POA: Diagnosis not present

## 2020-07-22 DIAGNOSIS — F0390 Unspecified dementia without behavioral disturbance: Secondary | ICD-10-CM | POA: Diagnosis not present

## 2020-07-22 DIAGNOSIS — Z862 Personal history of diseases of the blood and blood-forming organs and certain disorders involving the immune mechanism: Secondary | ICD-10-CM | POA: Diagnosis not present

## 2020-07-22 DIAGNOSIS — Z79899 Other long term (current) drug therapy: Secondary | ICD-10-CM | POA: Diagnosis not present

## 2020-07-22 DIAGNOSIS — E785 Hyperlipidemia, unspecified: Secondary | ICD-10-CM | POA: Diagnosis not present

## 2020-07-22 DIAGNOSIS — G9341 Metabolic encephalopathy: Secondary | ICD-10-CM | POA: Diagnosis not present

## 2020-07-22 DIAGNOSIS — Z742 Need for assistance at home and no other household member able to render care: Secondary | ICD-10-CM | POA: Diagnosis not present

## 2020-07-22 DIAGNOSIS — M1990 Unspecified osteoarthritis, unspecified site: Secondary | ICD-10-CM | POA: Diagnosis not present

## 2020-07-22 DIAGNOSIS — R739 Hyperglycemia, unspecified: Secondary | ICD-10-CM | POA: Diagnosis not present

## 2020-07-22 DIAGNOSIS — D801 Nonfamilial hypogammaglobulinemia: Secondary | ICD-10-CM | POA: Diagnosis not present

## 2020-07-22 DIAGNOSIS — J439 Emphysema, unspecified: Secondary | ICD-10-CM | POA: Diagnosis not present

## 2020-07-22 DIAGNOSIS — I10 Essential (primary) hypertension: Secondary | ICD-10-CM | POA: Diagnosis not present

## 2020-07-22 DIAGNOSIS — K743 Primary biliary cirrhosis: Secondary | ICD-10-CM | POA: Diagnosis not present

## 2020-07-30 DIAGNOSIS — K743 Primary biliary cirrhosis: Secondary | ICD-10-CM | POA: Diagnosis not present

## 2020-07-30 DIAGNOSIS — I10 Essential (primary) hypertension: Secondary | ICD-10-CM | POA: Diagnosis not present

## 2020-07-30 DIAGNOSIS — F0390 Unspecified dementia without behavioral disturbance: Secondary | ICD-10-CM | POA: Diagnosis not present

## 2020-07-30 DIAGNOSIS — J439 Emphysema, unspecified: Secondary | ICD-10-CM | POA: Diagnosis not present

## 2020-07-30 DIAGNOSIS — G9341 Metabolic encephalopathy: Secondary | ICD-10-CM | POA: Diagnosis not present

## 2020-07-30 DIAGNOSIS — K7290 Hepatic failure, unspecified without coma: Secondary | ICD-10-CM | POA: Diagnosis not present

## 2020-07-31 DIAGNOSIS — S32000A Wedge compression fracture of unspecified lumbar vertebra, initial encounter for closed fracture: Secondary | ICD-10-CM | POA: Diagnosis not present

## 2020-08-05 DIAGNOSIS — L03114 Cellulitis of left upper limb: Secondary | ICD-10-CM | POA: Diagnosis not present

## 2020-08-08 DIAGNOSIS — M545 Low back pain, unspecified: Secondary | ICD-10-CM | POA: Diagnosis not present

## 2020-08-08 DIAGNOSIS — M5124 Other intervertebral disc displacement, thoracic region: Secondary | ICD-10-CM | POA: Diagnosis not present

## 2020-08-16 DIAGNOSIS — I10 Essential (primary) hypertension: Secondary | ICD-10-CM | POA: Diagnosis not present

## 2020-08-16 DIAGNOSIS — K743 Primary biliary cirrhosis: Secondary | ICD-10-CM | POA: Diagnosis not present

## 2020-08-16 DIAGNOSIS — G9341 Metabolic encephalopathy: Secondary | ICD-10-CM | POA: Diagnosis not present

## 2020-08-16 DIAGNOSIS — M810 Age-related osteoporosis without current pathological fracture: Secondary | ICD-10-CM | POA: Diagnosis not present

## 2020-08-16 DIAGNOSIS — K7290 Hepatic failure, unspecified without coma: Secondary | ICD-10-CM | POA: Diagnosis not present

## 2020-08-16 DIAGNOSIS — J439 Emphysema, unspecified: Secondary | ICD-10-CM | POA: Diagnosis not present

## 2020-08-16 DIAGNOSIS — S32000A Wedge compression fracture of unspecified lumbar vertebra, initial encounter for closed fracture: Secondary | ICD-10-CM | POA: Diagnosis not present

## 2020-08-16 DIAGNOSIS — F0390 Unspecified dementia without behavioral disturbance: Secondary | ICD-10-CM | POA: Diagnosis not present

## 2020-08-17 DIAGNOSIS — K7290 Hepatic failure, unspecified without coma: Secondary | ICD-10-CM | POA: Diagnosis not present

## 2020-08-17 DIAGNOSIS — K743 Primary biliary cirrhosis: Secondary | ICD-10-CM | POA: Diagnosis not present

## 2020-08-17 DIAGNOSIS — I10 Essential (primary) hypertension: Secondary | ICD-10-CM | POA: Diagnosis not present

## 2020-08-17 DIAGNOSIS — G9341 Metabolic encephalopathy: Secondary | ICD-10-CM | POA: Diagnosis not present

## 2020-08-17 DIAGNOSIS — J439 Emphysema, unspecified: Secondary | ICD-10-CM | POA: Diagnosis not present

## 2020-08-17 DIAGNOSIS — F0390 Unspecified dementia without behavioral disturbance: Secondary | ICD-10-CM | POA: Diagnosis not present

## 2020-08-20 DIAGNOSIS — K7460 Unspecified cirrhosis of liver: Secondary | ICD-10-CM | POA: Diagnosis not present

## 2020-08-20 DIAGNOSIS — M8008XA Age-related osteoporosis with current pathological fracture, vertebra(e), initial encounter for fracture: Secondary | ICD-10-CM | POA: Diagnosis not present

## 2020-08-20 DIAGNOSIS — S32040A Wedge compression fracture of fourth lumbar vertebra, initial encounter for closed fracture: Secondary | ICD-10-CM | POA: Diagnosis not present

## 2020-08-20 DIAGNOSIS — I10 Essential (primary) hypertension: Secondary | ICD-10-CM | POA: Diagnosis not present

## 2020-08-20 DIAGNOSIS — M816 Localized osteoporosis [Lequesne]: Secondary | ICD-10-CM | POA: Diagnosis not present

## 2020-08-20 DIAGNOSIS — M810 Age-related osteoporosis without current pathological fracture: Secondary | ICD-10-CM | POA: Diagnosis not present

## 2020-08-20 DIAGNOSIS — S32050A Wedge compression fracture of fifth lumbar vertebra, initial encounter for closed fracture: Secondary | ICD-10-CM | POA: Diagnosis not present

## 2020-08-20 DIAGNOSIS — E7800 Pure hypercholesterolemia, unspecified: Secondary | ICD-10-CM | POA: Diagnosis not present

## 2020-09-10 DIAGNOSIS — S50311A Abrasion of right elbow, initial encounter: Secondary | ICD-10-CM | POA: Diagnosis not present

## 2020-09-10 DIAGNOSIS — S41101A Unspecified open wound of right upper arm, initial encounter: Secondary | ICD-10-CM | POA: Diagnosis not present

## 2020-09-12 DIAGNOSIS — Z0000 Encounter for general adult medical examination without abnormal findings: Secondary | ICD-10-CM | POA: Diagnosis not present

## 2020-09-12 DIAGNOSIS — M4850XA Collapsed vertebra, not elsewhere classified, site unspecified, initial encounter for fracture: Secondary | ICD-10-CM | POA: Diagnosis not present

## 2020-09-12 DIAGNOSIS — J449 Chronic obstructive pulmonary disease, unspecified: Secondary | ICD-10-CM | POA: Diagnosis not present

## 2020-09-12 DIAGNOSIS — K743 Primary biliary cirrhosis: Secondary | ICD-10-CM | POA: Diagnosis not present

## 2020-09-12 DIAGNOSIS — M810 Age-related osteoporosis without current pathological fracture: Secondary | ICD-10-CM | POA: Diagnosis not present

## 2020-09-13 DIAGNOSIS — S42002A Fracture of unspecified part of left clavicle, initial encounter for closed fracture: Secondary | ICD-10-CM | POA: Diagnosis not present

## 2020-09-13 DIAGNOSIS — Z452 Encounter for adjustment and management of vascular access device: Secondary | ICD-10-CM | POA: Diagnosis not present

## 2020-09-13 DIAGNOSIS — Z792 Long term (current) use of antibiotics: Secondary | ICD-10-CM | POA: Diagnosis not present

## 2020-09-13 DIAGNOSIS — Z743 Need for continuous supervision: Secondary | ICD-10-CM | POA: Diagnosis not present

## 2020-09-13 DIAGNOSIS — E876 Hypokalemia: Secondary | ICD-10-CM | POA: Diagnosis not present

## 2020-09-13 DIAGNOSIS — E860 Dehydration: Secondary | ICD-10-CM | POA: Diagnosis not present

## 2020-09-13 DIAGNOSIS — Z9981 Dependence on supplemental oxygen: Secondary | ICD-10-CM | POA: Diagnosis not present

## 2020-09-13 DIAGNOSIS — R4182 Altered mental status, unspecified: Secondary | ICD-10-CM | POA: Diagnosis not present

## 2020-09-13 DIAGNOSIS — Z872 Personal history of diseases of the skin and subcutaneous tissue: Secondary | ICD-10-CM | POA: Diagnosis not present

## 2020-09-13 DIAGNOSIS — R6521 Severe sepsis with septic shock: Secondary | ICD-10-CM | POA: Diagnosis not present

## 2020-09-13 DIAGNOSIS — S32020A Wedge compression fracture of second lumbar vertebra, initial encounter for closed fracture: Secondary | ICD-10-CM | POA: Diagnosis present

## 2020-09-13 DIAGNOSIS — Z79899 Other long term (current) drug therapy: Secondary | ICD-10-CM | POA: Diagnosis not present

## 2020-09-13 DIAGNOSIS — R52 Pain, unspecified: Secondary | ICD-10-CM | POA: Diagnosis not present

## 2020-09-13 DIAGNOSIS — S32030A Wedge compression fracture of third lumbar vertebra, initial encounter for closed fracture: Secondary | ICD-10-CM | POA: Diagnosis present

## 2020-09-13 DIAGNOSIS — R0902 Hypoxemia: Secondary | ICD-10-CM | POA: Diagnosis not present

## 2020-09-13 DIAGNOSIS — I10 Essential (primary) hypertension: Secondary | ICD-10-CM | POA: Diagnosis present

## 2020-09-13 DIAGNOSIS — Z515 Encounter for palliative care: Secondary | ICD-10-CM | POA: Diagnosis not present

## 2020-09-13 DIAGNOSIS — G9341 Metabolic encephalopathy: Secondary | ICD-10-CM | POA: Diagnosis present

## 2020-09-13 DIAGNOSIS — D649 Anemia, unspecified: Secondary | ICD-10-CM | POA: Diagnosis not present

## 2020-09-13 DIAGNOSIS — S41111A Laceration without foreign body of right upper arm, initial encounter: Secondary | ICD-10-CM | POA: Diagnosis present

## 2020-09-13 DIAGNOSIS — S32000D Wedge compression fracture of unspecified lumbar vertebra, subsequent encounter for fracture with routine healing: Secondary | ICD-10-CM | POA: Diagnosis not present

## 2020-09-13 DIAGNOSIS — K219 Gastro-esophageal reflux disease without esophagitis: Secondary | ICD-10-CM | POA: Diagnosis not present

## 2020-09-13 DIAGNOSIS — Z880 Allergy status to penicillin: Secondary | ICD-10-CM | POA: Diagnosis not present

## 2020-09-13 DIAGNOSIS — K7460 Unspecified cirrhosis of liver: Secondary | ICD-10-CM | POA: Diagnosis present

## 2020-09-13 DIAGNOSIS — R58 Hemorrhage, not elsewhere classified: Secondary | ICD-10-CM | POA: Diagnosis not present

## 2020-09-13 DIAGNOSIS — A419 Sepsis, unspecified organism: Secondary | ICD-10-CM | POA: Diagnosis present

## 2020-09-13 DIAGNOSIS — I959 Hypotension, unspecified: Secondary | ICD-10-CM | POA: Diagnosis not present

## 2020-09-13 DIAGNOSIS — Z66 Do not resuscitate: Secondary | ICD-10-CM | POA: Diagnosis present

## 2020-09-13 DIAGNOSIS — A4150 Gram-negative sepsis, unspecified: Secondary | ICD-10-CM | POA: Diagnosis present

## 2020-09-13 DIAGNOSIS — J439 Emphysema, unspecified: Secondary | ICD-10-CM | POA: Diagnosis present

## 2020-09-13 DIAGNOSIS — J449 Chronic obstructive pulmonary disease, unspecified: Secondary | ICD-10-CM | POA: Diagnosis not present

## 2020-09-13 DIAGNOSIS — E861 Hypovolemia: Secondary | ICD-10-CM | POA: Diagnosis present

## 2020-09-13 DIAGNOSIS — M549 Dorsalgia, unspecified: Secondary | ICD-10-CM | POA: Diagnosis not present

## 2020-09-13 DIAGNOSIS — S32010A Wedge compression fracture of first lumbar vertebra, initial encounter for closed fracture: Secondary | ICD-10-CM | POA: Diagnosis present

## 2020-09-13 DIAGNOSIS — M545 Low back pain, unspecified: Secondary | ICD-10-CM | POA: Diagnosis not present

## 2020-09-13 DIAGNOSIS — M1990 Unspecified osteoarthritis, unspecified site: Secondary | ICD-10-CM | POA: Diagnosis present

## 2020-09-13 DIAGNOSIS — N170 Acute kidney failure with tubular necrosis: Secondary | ICD-10-CM | POA: Diagnosis present

## 2020-09-13 DIAGNOSIS — L03113 Cellulitis of right upper limb: Secondary | ICD-10-CM | POA: Diagnosis not present

## 2020-09-13 DIAGNOSIS — S32000A Wedge compression fracture of unspecified lumbar vertebra, initial encounter for closed fracture: Secondary | ICD-10-CM | POA: Diagnosis not present

## 2020-09-13 DIAGNOSIS — E872 Acidosis: Secondary | ICD-10-CM | POA: Diagnosis present

## 2020-09-13 DIAGNOSIS — Z9181 History of falling: Secondary | ICD-10-CM | POA: Diagnosis not present

## 2020-09-18 DIAGNOSIS — I959 Hypotension, unspecified: Secondary | ICD-10-CM | POA: Diagnosis not present

## 2020-09-18 DIAGNOSIS — R0902 Hypoxemia: Secondary | ICD-10-CM | POA: Diagnosis not present

## 2020-09-18 DIAGNOSIS — Z743 Need for continuous supervision: Secondary | ICD-10-CM | POA: Diagnosis not present

## 2020-09-20 DEATH — deceased
